# Patient Record
Sex: Female | Born: 1935 | Race: White | Hispanic: No | Smoking: Never smoker
Health system: Southern US, Community
[De-identification: ages and names within clinical notes are randomized; demographics above are authoritative.]

## PROBLEM LIST (undated history)

## (undated) DIAGNOSIS — I10 Essential (primary) hypertension: Secondary | ICD-10-CM

## (undated) DIAGNOSIS — E039 Hypothyroidism, unspecified: Secondary | ICD-10-CM

## (undated) DIAGNOSIS — Z923 Personal history of irradiation: Secondary | ICD-10-CM

## (undated) DIAGNOSIS — C50919 Malignant neoplasm of unspecified site of unspecified female breast: Secondary | ICD-10-CM

## (undated) DIAGNOSIS — E782 Mixed hyperlipidemia: Secondary | ICD-10-CM

## (undated) HISTORY — DX: Personal history of irradiation: Z92.3

## (undated) HISTORY — DX: Malignant neoplasm of unspecified site of unspecified female breast: C50.919

## (undated) HISTORY — PX: ROTATOR CUFF REPAIR: SHX139

## (undated) HISTORY — DX: Mixed hyperlipidemia: E78.2

## (undated) HISTORY — PX: ABDOMINAL HYSTERECTOMY: SHX81

## (undated) HISTORY — PX: TOTAL HIP ARTHROPLASTY: SHX124

---

## 1988-02-19 HISTORY — PX: MASTECTOMY: SHX3

## 2001-03-21 ENCOUNTER — Encounter: Payer: Self-pay | Admitting: Neurology

## 2001-03-21 ENCOUNTER — Ambulatory Visit (HOSPITAL_COMMUNITY): Admission: RE | Admit: 2001-03-21 | Discharge: 2001-03-21 | Payer: Self-pay | Admitting: Neurology

## 2004-06-05 ENCOUNTER — Ambulatory Visit: Payer: Self-pay | Admitting: Oncology

## 2004-12-05 ENCOUNTER — Ambulatory Visit: Payer: Self-pay | Admitting: Oncology

## 2005-01-22 ENCOUNTER — Inpatient Hospital Stay (HOSPITAL_COMMUNITY): Admission: EM | Admit: 2005-01-22 | Discharge: 2005-01-25 | Payer: Self-pay | Admitting: Emergency Medicine

## 2005-01-22 ENCOUNTER — Ambulatory Visit: Payer: Self-pay | Admitting: Physical Medicine & Rehabilitation

## 2005-01-25 ENCOUNTER — Inpatient Hospital Stay (HOSPITAL_COMMUNITY)
Admission: RE | Admit: 2005-01-25 | Discharge: 2005-02-01 | Payer: Self-pay | Admitting: Physical Medicine & Rehabilitation

## 2005-02-03 ENCOUNTER — Inpatient Hospital Stay (HOSPITAL_COMMUNITY): Admission: AD | Admit: 2005-02-03 | Discharge: 2005-02-07 | Payer: Self-pay | Admitting: Orthopedic Surgery

## 2005-12-05 ENCOUNTER — Ambulatory Visit: Payer: Self-pay | Admitting: Oncology

## 2006-01-17 ENCOUNTER — Emergency Department (HOSPITAL_COMMUNITY): Admission: EM | Admit: 2006-01-17 | Discharge: 2006-01-17 | Payer: Self-pay | Admitting: Emergency Medicine

## 2006-11-22 IMAGING — CR DG HIP 1V PORT*L*
1 series · 1 of 1 positions shown · non-contrast
Comparison: pelvis film of earlier in the evening

CLINICAL DATA: arthroplasty
 PORTABLE LATERAL LEFT HIP- 1 VIEW:

[view not recorded]
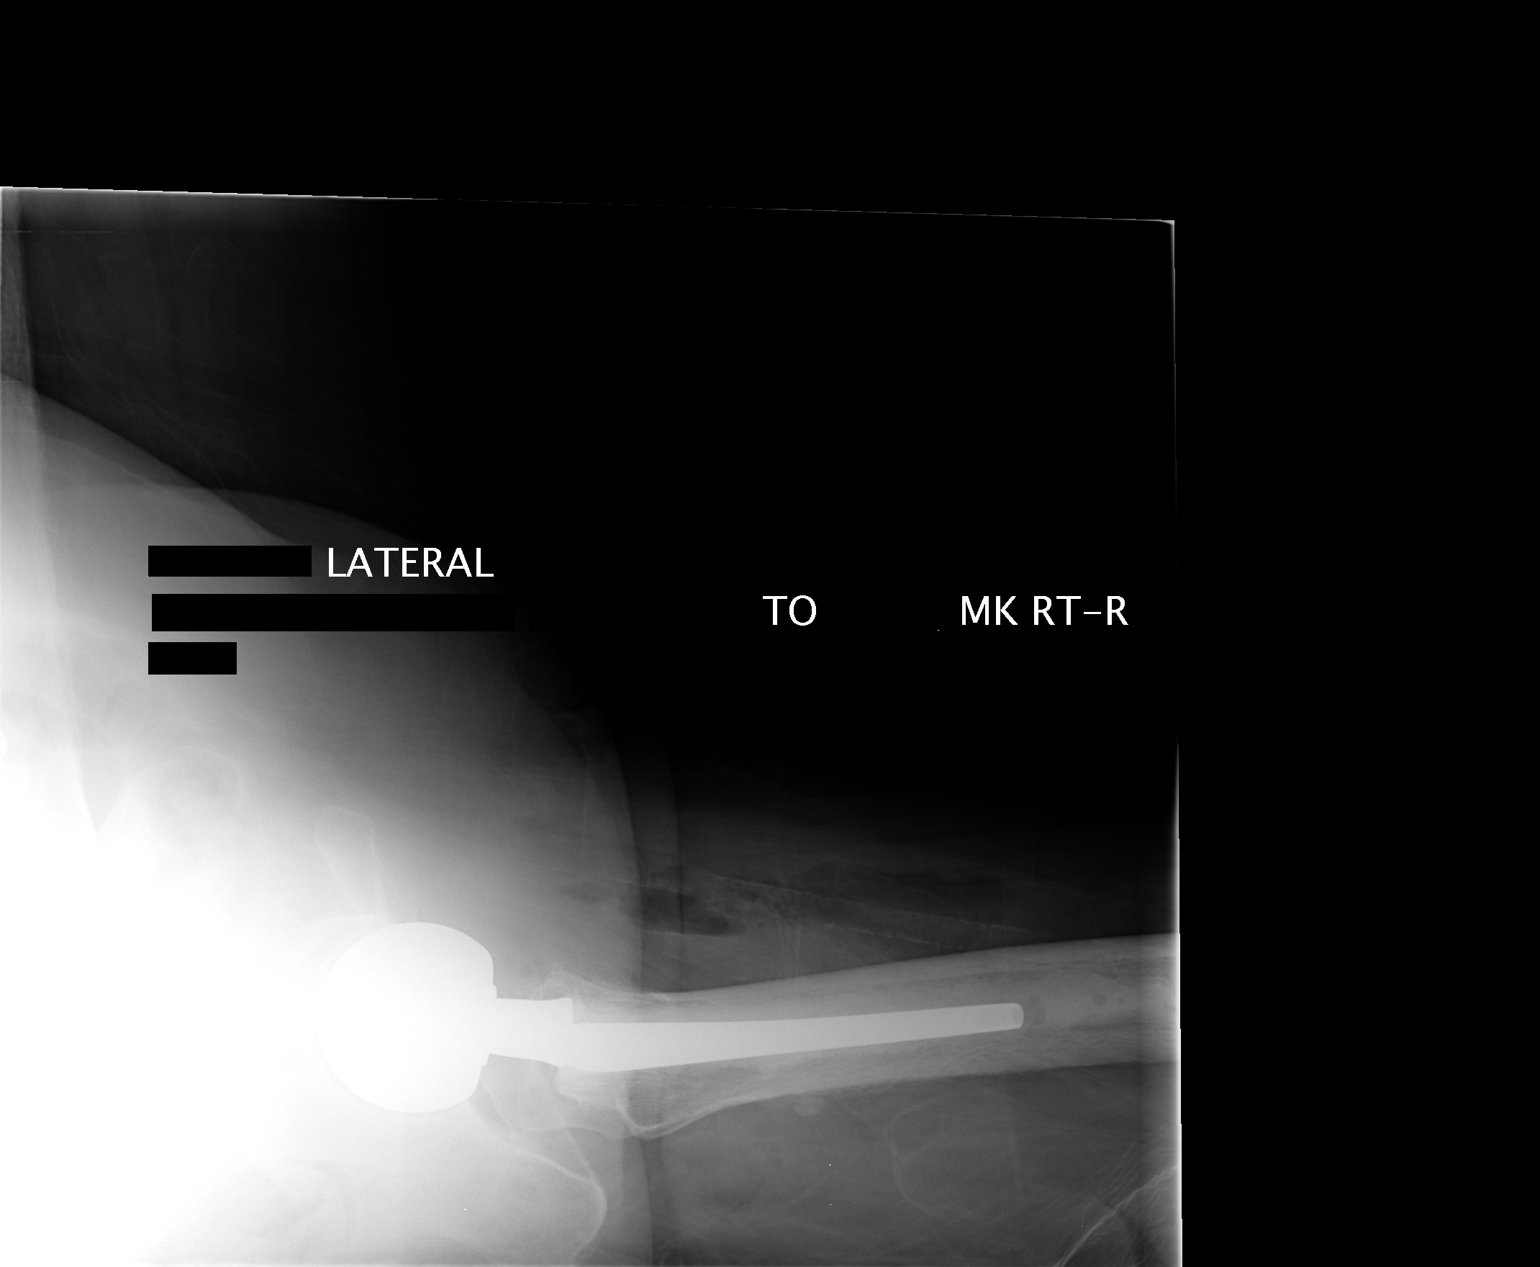

[1 of 1 positions shown; findings below may reference images not displayed]

FINDINGS: Patient is status post left hip arthroplasty.  There is no evidence of dislocation.  No periprosthetic fracture or other complication identified.  
 Small amount of subcutaneous air is seen.
IMPRESSION: Expected postoperative appearance status post left hip arthroplasty.

## 2006-11-22 IMAGING — CR DG CHEST 1V
1 series · 1 of 1 positions shown · non-contrast
Comparison: none

CLINICAL DATA: Left hip fracture.
 CHEST RADIOGRAPH ? 1 VIEW ? 01/22/05: 
 No prior studies.

[view not recorded]
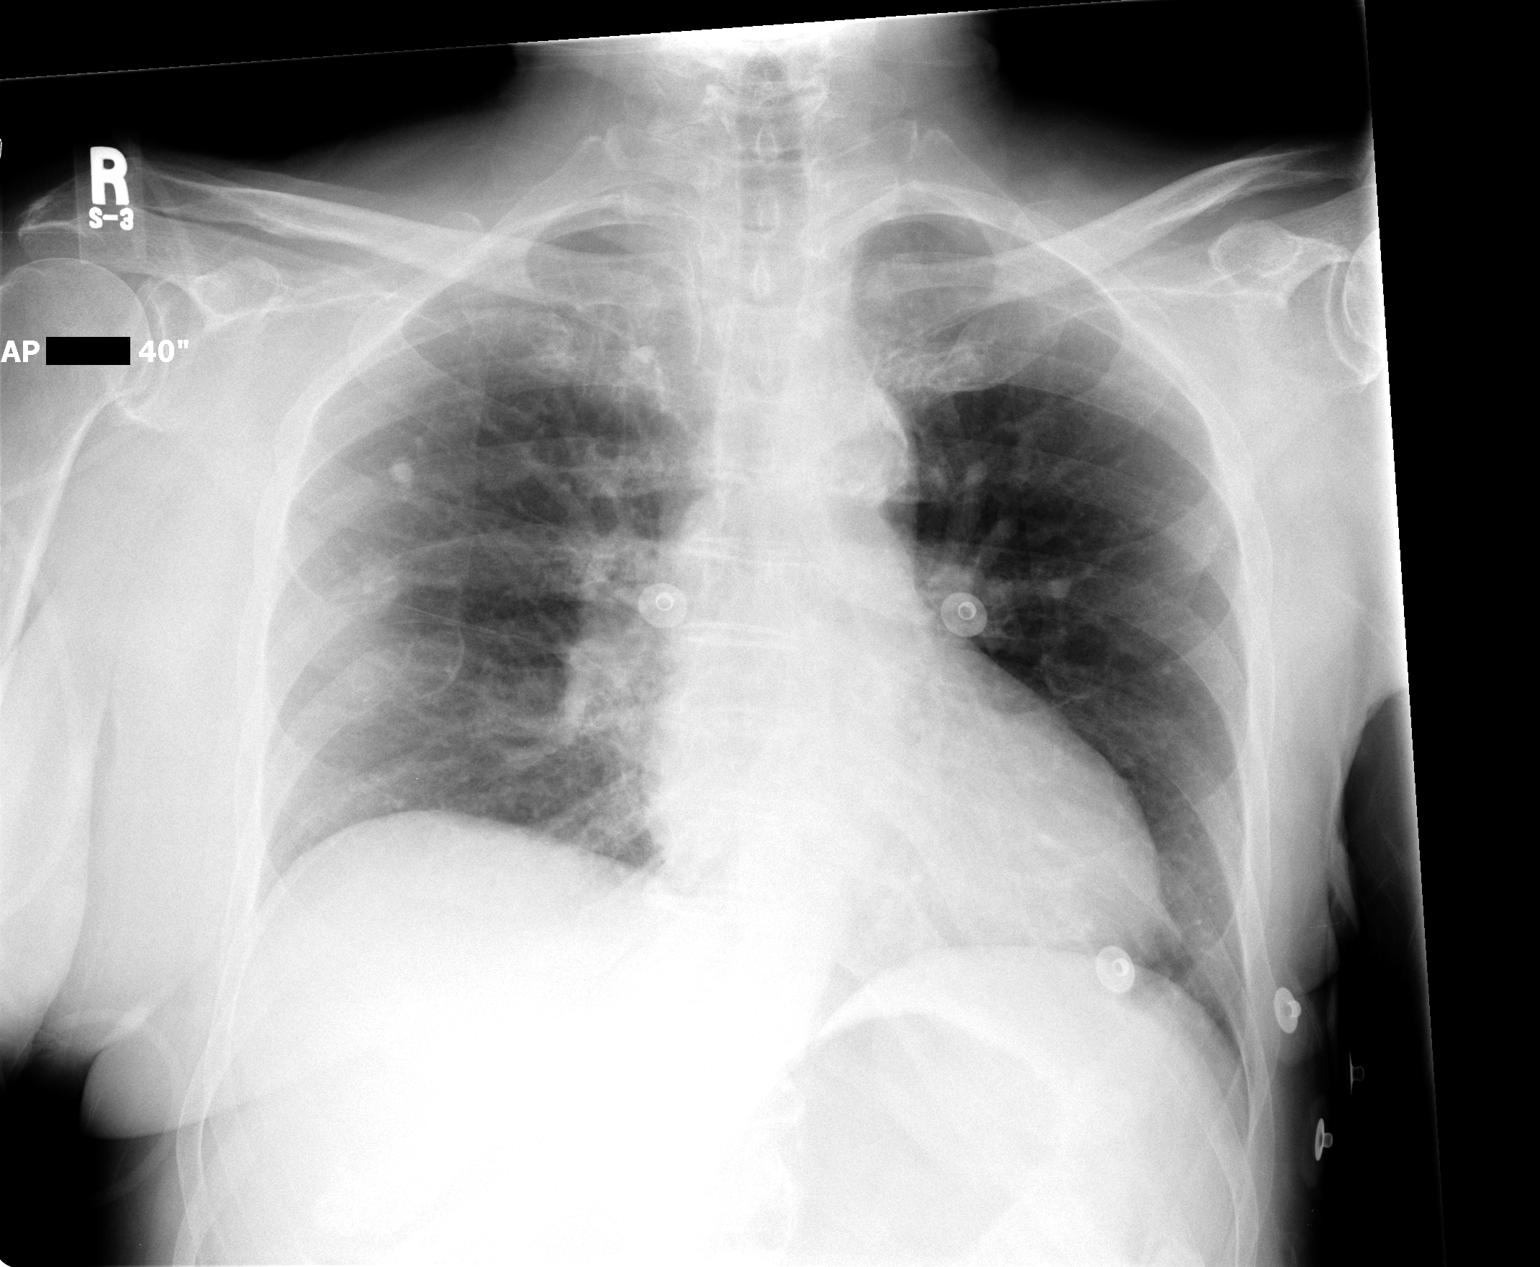

[1 of 1 positions shown; findings below may reference images not displayed]

FINDINGS: There is ill-defined nodularity projecting over the posterolateral portion of the sixth rib, with indistinctness of the sixth rib itself.  While this could represent confluence of vascular shadows, I cannot exclude a pulmonary nodule at this site.  Moreover, just cephalad to this projecting over the right rib, there is a rounded density at the tip of several pulmonary vessels which may represent a vessel on end or a calcified granuloma.  I doubt that this second finding represents a true pulmonary nodule.  However, given the constellation of findings, I would suggest either follow-up chest radiography in one month time, or chest CT for further characterization.  
 There is atherosclerotic calcification of the aortic arch.
IMPRESSION: 1.  Vague nodularity in the right midlung.  Some of this may represent confluence of osseous and vascular shadows, but I recommend at least follow-up chest radiograph in one months time for further characterization.

## 2006-12-04 IMAGING — CR DG HIP (WITH OR WITHOUT PELVIS) 2-3V*L*
3 series · 3 of 3 positions shown · non-contrast
Comparison: 01/22/05.

CLINICAL DATA: Post-op from left hip replacement.  Worsening left hip pain.  Nausea and vomiting.
 LEFT HIP WITH PELVIS ? 3 VIEW:

[t hip ap left]
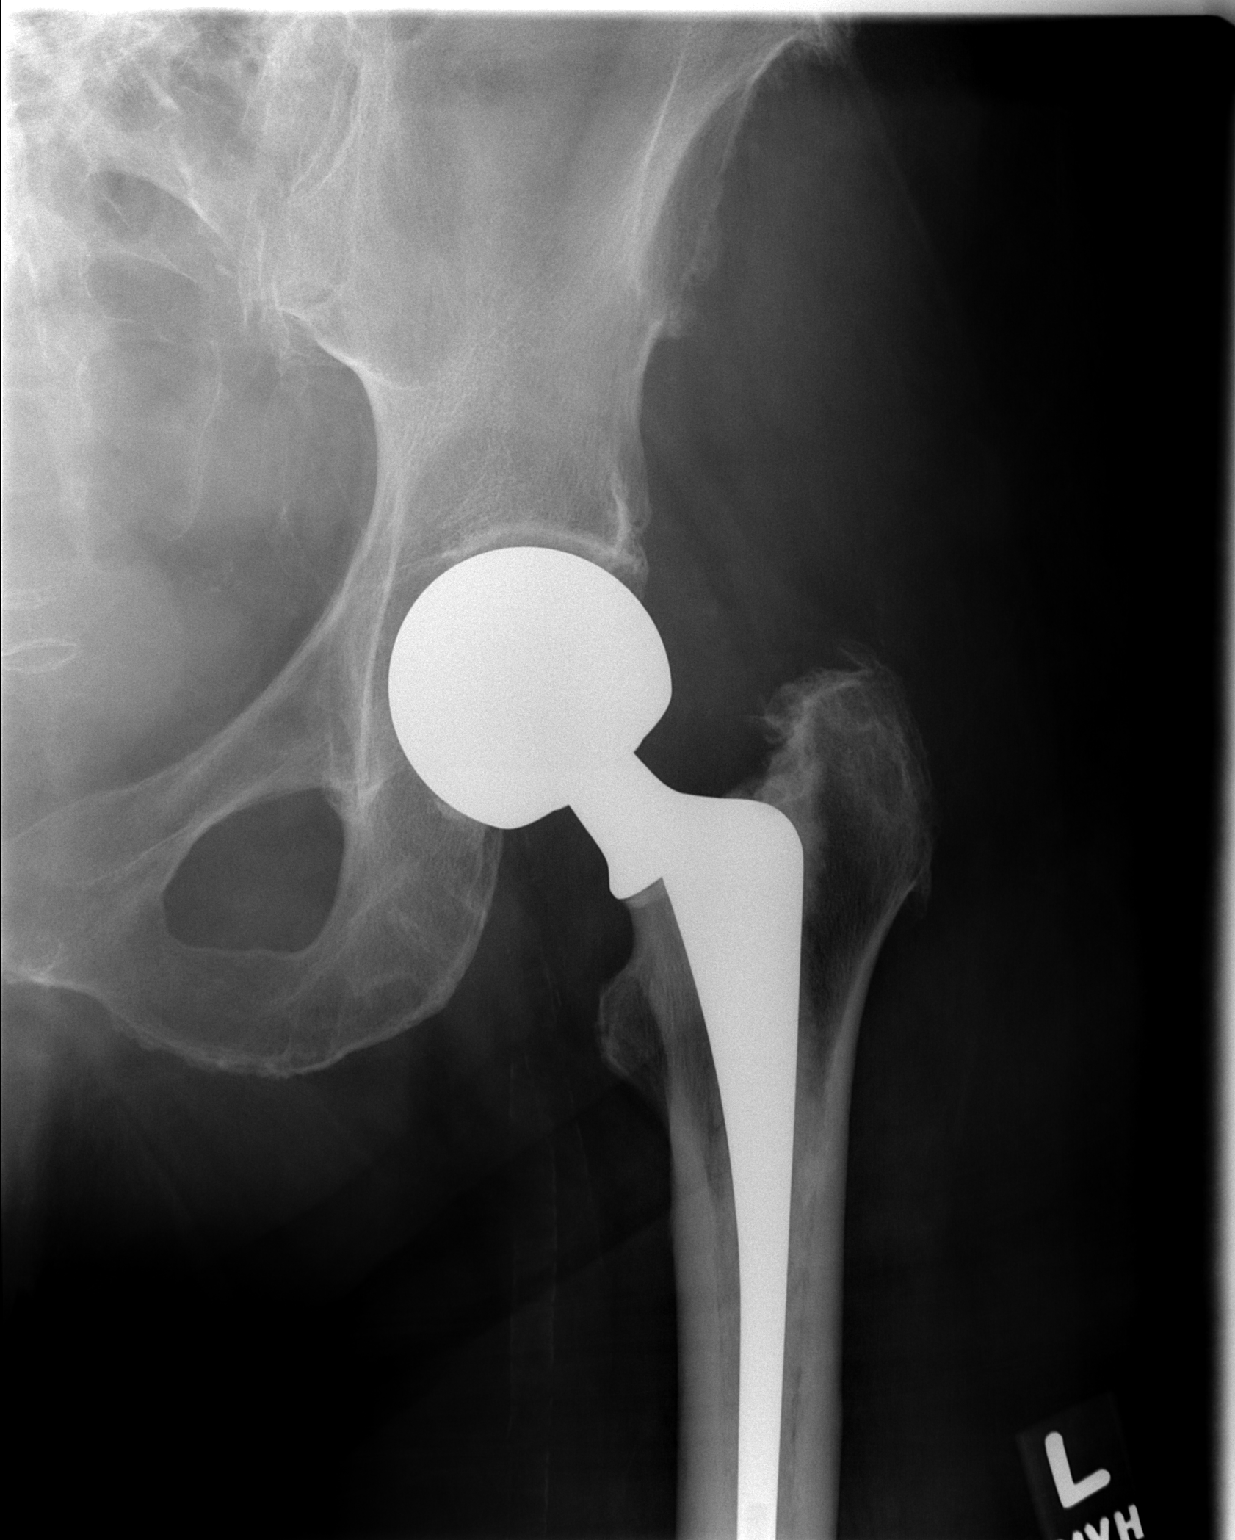

[t pelvis a.p.]
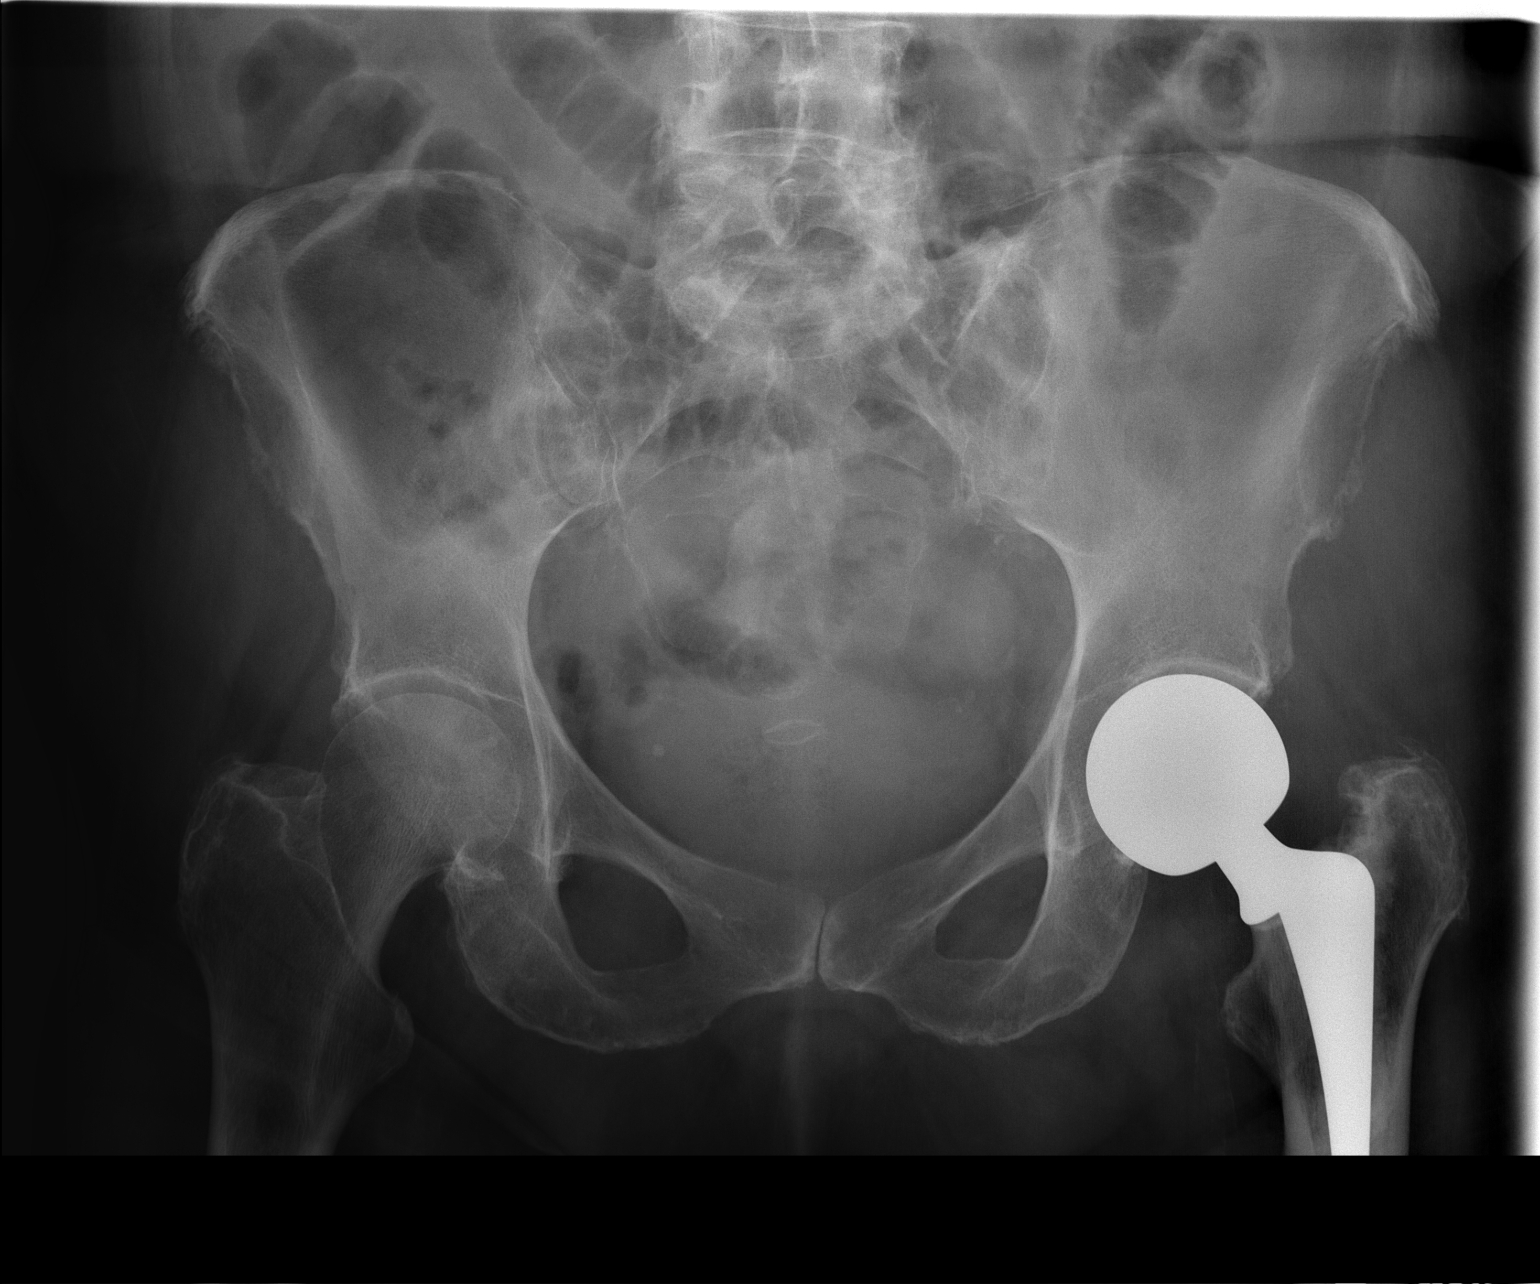

[w hip lateral left *]
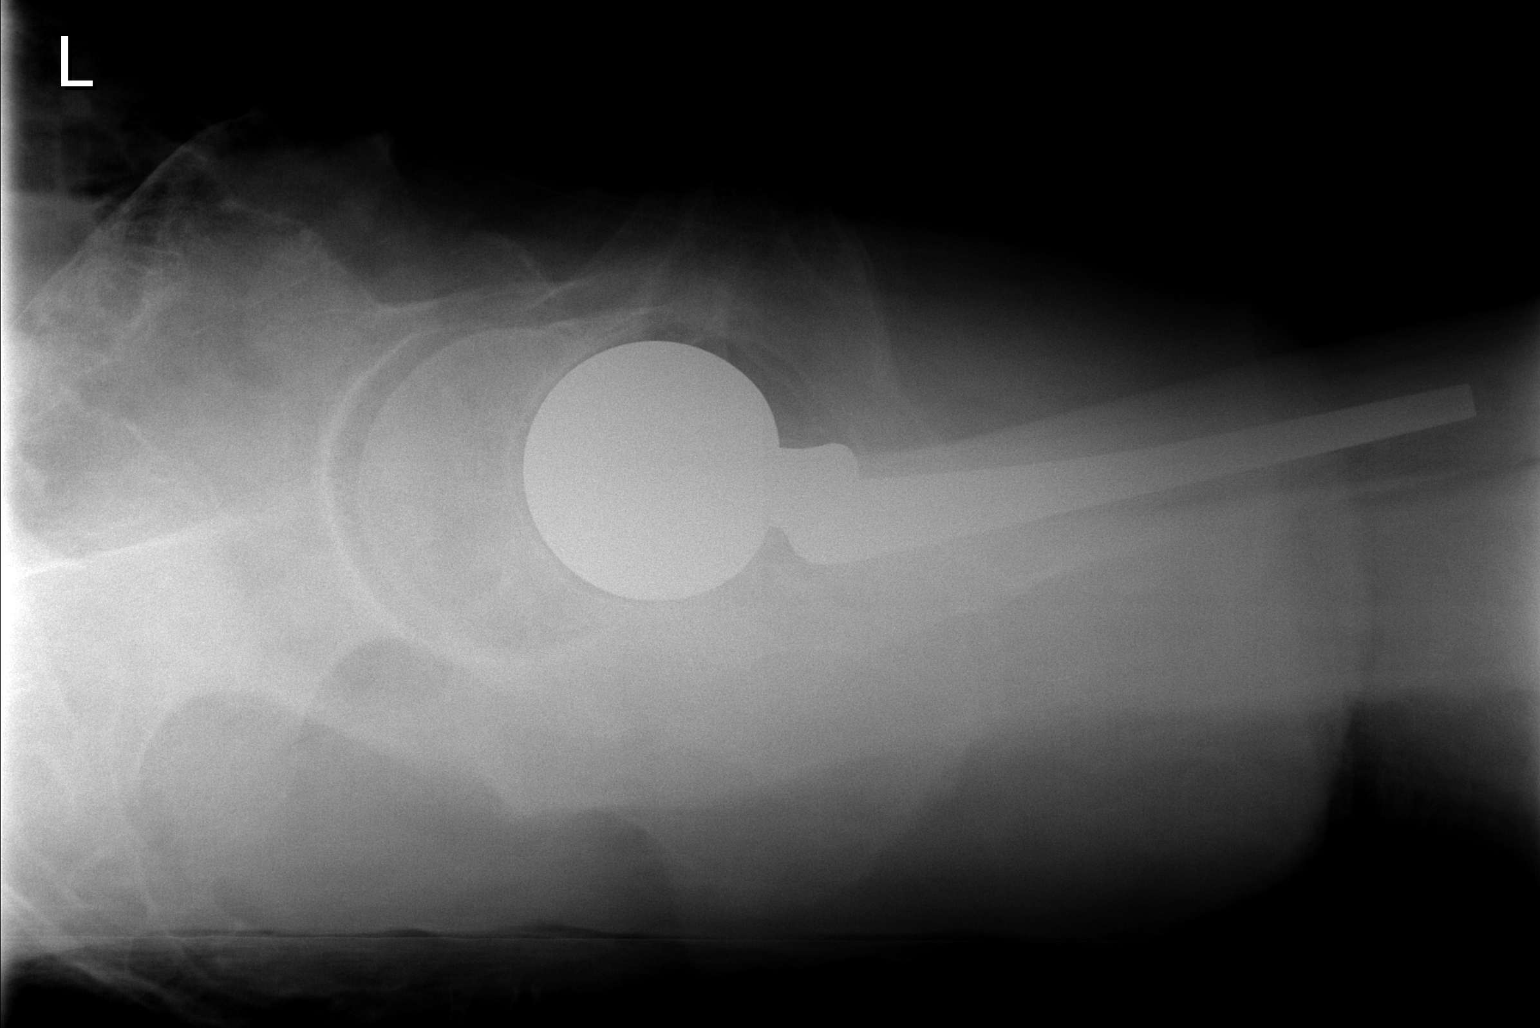

[3 of 3 positions shown; findings below may reference images not displayed]

FINDINGS: A left hip prosthesis is seen in expected position.  There is no evidence of fracture or dislocation.  There is no evidence of bone destruction or periosteal reaction.  No other bone abnormalities are identified.  Decreased subcutaneous emphysema is seen compared with the prior study.
IMPRESSION: Expected postoperative appearance of the left hip arthroplasty.  No evidence of fracture or other complication.
 ABDOMEN ? 2 VIEW:
FINDINGS: Scattered small bowel and colonic gas is seen without evidence of dilated bowel loops.  There is no evidence of free intraperitoneal air.  No radiopaque calculi are seen.  Several pelvic phleboliths are noted.
IMPRESSION: Nonspecific, nonobstructive bowel gas pattern.

## 2006-12-16 ENCOUNTER — Ambulatory Visit: Payer: Self-pay | Admitting: Oncology

## 2006-12-18 LAB — CBC WITH DIFFERENTIAL/PLATELET
BASO%: 0.4 % (ref 0.0–2.0)
Basophils Absolute: 0 10e3/uL (ref 0.0–0.1)
EOS%: 2.2 % (ref 0.0–7.0)
Eosinophils Absolute: 0.2 10e3/uL (ref 0.0–0.5)
HCT: 36.8 % (ref 34.8–46.6)
HGB: 12.9 g/dL (ref 11.6–15.9)
LYMPH%: 23.4 % (ref 14.0–48.0)
MCH: 31.5 pg (ref 26.0–34.0)
MCHC: 35 g/dL (ref 32.0–36.0)
MCV: 89.9 fL (ref 81.0–101.0)
MONO#: 0.5 10e3/uL (ref 0.1–0.9)
MONO%: 5.9 % (ref 0.0–13.0)
NEUT#: 5.3 10e3/uL (ref 1.5–6.5)
NEUT%: 68.1 % (ref 39.6–76.8)
Platelets: 312 10e3/uL (ref 145–400)
RBC: 4.09 10e6/uL (ref 3.70–5.32)
RDW: 13.2 % (ref 11.3–14.5)
WBC: 7.9 10e3/uL (ref 3.9–10.0)
lymph#: 1.8 10e3/uL (ref 0.9–3.3)

## 2006-12-19 LAB — COMPREHENSIVE METABOLIC PANEL
AST: 17 U/L (ref 0–37)
BUN: 18 mg/dL (ref 6–23)
Calcium: 9.1 mg/dL (ref 8.4–10.5)
Chloride: 105 mEq/L (ref 96–112)
Creatinine, Ser: 0.96 mg/dL (ref 0.40–1.20)
Glucose, Bld: 145 mg/dL — ABNORMAL HIGH (ref 70–99)

## 2006-12-19 LAB — CANCER ANTIGEN 27.29: CA 27.29: 17 U/mL (ref 0–39)

## 2006-12-19 LAB — LACTATE DEHYDROGENASE: LDH: 162 U/L (ref 94–250)

## 2007-12-15 ENCOUNTER — Ambulatory Visit: Payer: Self-pay | Admitting: Oncology

## 2008-02-16 ENCOUNTER — Ambulatory Visit: Payer: Self-pay | Admitting: Oncology

## 2008-02-18 LAB — CBC WITH DIFFERENTIAL/PLATELET
BASO%: 0.8 % (ref 0.0–2.0)
Basophils Absolute: 0 10*3/uL (ref 0.0–0.1)
EOS%: 5.4 % (ref 0.0–7.0)
Eosinophils Absolute: 0.2 10*3/uL (ref 0.0–0.5)
HCT: 34 % — ABNORMAL LOW (ref 34.8–46.6)
HGB: 11.7 g/dL (ref 11.6–15.9)
LYMPH%: 41.9 % (ref 14.0–48.0)
MCH: 29.2 pg (ref 26.0–34.0)
MCHC: 34.5 g/dL (ref 32.0–36.0)
MCV: 84.8 fL (ref 81.0–101.0)
MONO#: 1.1 10*3/uL — ABNORMAL HIGH (ref 0.1–0.9)
MONO%: 26.4 % — ABNORMAL HIGH (ref 0.0–13.0)
NEUT#: 1 10*3/uL — ABNORMAL LOW (ref 1.5–6.5)
NEUT%: 25.5 % — ABNORMAL LOW (ref 39.6–76.8)
Platelets: 328 10*3/uL (ref 145–400)
RBC: 4.01 10*6/uL (ref 3.70–5.32)
RDW: 14.3 % (ref 11.3–14.5)
WBC: 4.1 10*3/uL (ref 3.9–10.0)
lymph#: 1.7 10*3/uL (ref 0.9–3.3)

## 2008-02-22 LAB — COMPREHENSIVE METABOLIC PANEL
AST: 17 U/L (ref 0–37)
Albumin: 3.9 g/dL (ref 3.5–5.2)
Alkaline Phosphatase: 106 U/L (ref 39–117)
Potassium: 4.1 mEq/L (ref 3.5–5.3)
Sodium: 137 mEq/L (ref 135–145)
Total Protein: 6.6 g/dL (ref 6.0–8.3)

## 2008-02-22 LAB — VITAMIN D 25 HYDROXY (VIT D DEFICIENCY, FRACTURES): Vit D, 25-Hydroxy: 44 ng/mL (ref 30–89)

## 2008-06-30 ENCOUNTER — Ambulatory Visit: Admission: RE | Admit: 2008-06-30 | Discharge: 2008-06-30 | Payer: Self-pay | Admitting: Gynecologic Oncology

## 2008-07-12 ENCOUNTER — Encounter: Payer: Self-pay | Admitting: Gynecologic Oncology

## 2008-07-12 ENCOUNTER — Inpatient Hospital Stay (HOSPITAL_COMMUNITY): Admission: RE | Admit: 2008-07-12 | Discharge: 2008-07-13 | Payer: Self-pay | Admitting: Obstetrics & Gynecology

## 2008-07-21 ENCOUNTER — Ambulatory Visit (HOSPITAL_COMMUNITY): Admission: RE | Admit: 2008-07-21 | Discharge: 2008-07-21 | Payer: Self-pay | Admitting: Obstetrics & Gynecology

## 2008-07-21 ENCOUNTER — Ambulatory Visit: Payer: Self-pay | Admitting: Vascular Surgery

## 2008-07-21 ENCOUNTER — Encounter: Payer: Self-pay | Admitting: Obstetrics & Gynecology

## 2008-07-28 ENCOUNTER — Ambulatory Visit: Admission: RE | Admit: 2008-07-28 | Discharge: 2008-07-28 | Payer: Self-pay | Admitting: Gynecologic Oncology

## 2008-08-25 ENCOUNTER — Ambulatory Visit: Admission: RE | Admit: 2008-08-25 | Discharge: 2008-08-25 | Payer: Self-pay | Admitting: Gynecologic Oncology

## 2008-09-30 ENCOUNTER — Ambulatory Visit: Admission: RE | Admit: 2008-09-30 | Discharge: 2008-11-02 | Payer: Self-pay | Admitting: Radiation Oncology

## 2008-10-20 ENCOUNTER — Ambulatory Visit (HOSPITAL_COMMUNITY): Admission: RE | Admit: 2008-10-20 | Discharge: 2008-10-20 | Payer: Self-pay | Admitting: Radiation Oncology

## 2009-03-30 ENCOUNTER — Other Ambulatory Visit: Admission: RE | Admit: 2009-03-30 | Discharge: 2009-03-30 | Payer: Self-pay | Admitting: Gynecologic Oncology

## 2009-03-30 ENCOUNTER — Ambulatory Visit: Admission: RE | Admit: 2009-03-30 | Discharge: 2009-03-30 | Payer: Self-pay | Admitting: Gynecologic Oncology

## 2009-07-04 ENCOUNTER — Other Ambulatory Visit: Admission: RE | Admit: 2009-07-04 | Discharge: 2009-07-04 | Payer: Self-pay | Admitting: Radiation Oncology

## 2009-07-04 ENCOUNTER — Ambulatory Visit: Admission: RE | Admit: 2009-07-04 | Discharge: 2009-07-04 | Payer: Self-pay | Admitting: Radiation Oncology

## 2009-10-05 ENCOUNTER — Ambulatory Visit: Admission: RE | Admit: 2009-10-05 | Discharge: 2009-10-05 | Payer: Self-pay | Admitting: Gynecologic Oncology

## 2010-01-29 ENCOUNTER — Other Ambulatory Visit
Admission: RE | Admit: 2010-01-29 | Discharge: 2010-01-29 | Payer: Self-pay | Source: Home / Self Care | Admitting: Radiation Oncology

## 2010-01-29 ENCOUNTER — Ambulatory Visit
Admission: RE | Admit: 2010-01-29 | Discharge: 2010-01-29 | Payer: Self-pay | Source: Home / Self Care | Attending: Radiation Oncology | Admitting: Radiation Oncology

## 2010-05-24 ENCOUNTER — Ambulatory Visit: Payer: Medicare Other | Attending: Gynecologic Oncology | Admitting: Gynecologic Oncology

## 2010-05-24 DIAGNOSIS — I10 Essential (primary) hypertension: Secondary | ICD-10-CM | POA: Insufficient documentation

## 2010-05-24 DIAGNOSIS — C549 Malignant neoplasm of corpus uteri, unspecified: Secondary | ICD-10-CM | POA: Insufficient documentation

## 2010-05-24 DIAGNOSIS — F0280 Dementia in other diseases classified elsewhere without behavioral disturbance: Secondary | ICD-10-CM | POA: Insufficient documentation

## 2010-05-24 DIAGNOSIS — E7800 Pure hypercholesterolemia, unspecified: Secondary | ICD-10-CM | POA: Insufficient documentation

## 2010-05-24 DIAGNOSIS — G309 Alzheimer's disease, unspecified: Secondary | ICD-10-CM | POA: Insufficient documentation

## 2010-05-24 DIAGNOSIS — Z853 Personal history of malignant neoplasm of breast: Secondary | ICD-10-CM | POA: Insufficient documentation

## 2010-05-24 DIAGNOSIS — E039 Hypothyroidism, unspecified: Secondary | ICD-10-CM | POA: Insufficient documentation

## 2010-05-28 NOTE — Consult Note (Signed)
[name], Jasmin Beck NO.:  [id number]  MEDICAL RECORD NO.:  [id number]          PATIENT TYPE:  OUT  LOCATION:  [facility]                         FACILITY:  [facility]  PHYSICIAN:  Laurette Schimke, MD     DATE OF BIRTH:  [birth year]  DATE OF CONSULTATION:  05/24/2010 DATE OF DISCHARGE:                                CONSULTATION   REASON FOR VISIT:  Surveillance of stage II endometrial cancer.  HISTORY OF PRESENT ILLNESS:  This is an almost 77 year old who presented with uterine bleeding in February 2010.  Dr. Leota Jacobsen evaluated her and performed an endometrial biopsy that demonstrated a grade 1 endometrial cancer.  On Jul 12, 2008, she underwent a total laparoscopic hysterectomy, bilateral salpingo-oophorectomy, bilateral pelvic lymph node dissection and lysis of adhesions.  Final pathology was notable for involvement of the cervix with a grade 1 endometrioid adenocarcinoma. She was stage II and received adjuvant radiation therapy with high-dose- rate brachytherapy completed in September 2010.  Jasmin Beck has been without evidence of disease since.  PAST MEDICAL HISTORY:  Hypertension, hypercholesterolemia, left breast cancer diagnosed in 2010 treated with adjuvant tamoxifen for 2 years, hypothyroidism May 2010, worsening hearing loss, slowly progressive Alzheimer's disease.  PAST SURGICAL HISTORY:  Left modified radical mastectomy and axillary lymph node dissection in 2000, left rotator cuff surgery in 1980, left hip surgery, cesarean section in 1950, laparoscopic hysterectomy, bilateral salpingo-oophorectomy, bilateral pelvic lymph node dissection in 2010.  SOCIAL HISTORY:  Jasmin Beck lives on her own.  She lives across the street from her son.  Is independent.  She reports difficulty with ambulating and stumbling and requiring the use of a cane this year.  FAMILY HISTORY:  No family history changes.  REVIEW OF SYSTEMS:  Worsening hearing loss, even worse than  the last visit.  Denies vaginal bleeding.  No headache, cough, shortness of breath, chest pain.  No abdominal bloating.  No diarrhea or constipation, hematuria, hematochezia or lower extremity edema.  PHYSICAL EXAMINATION:  GENERAL:  A well-developed female in no acute distress.  In excellent spirits. VITAL SIGNS:  Weight 143 pounds, blood pressure 130/60, pulse of 74. CHEST:  Clear to auscultation. HEART:  Regular rate and rhythm. BACK:  No CVA tenderness. ABDOMEN:  Soft, obese, nontender.  Midline incision without any evidence of a hernia. LYMPH NODE SURVEY:  No cervical, supraclavicular or inguinal adenopathy. PELVIC EXAMINATION:  Normal external genitalia, Bartholin's, urethra and Skene's.  No lesions noted within the vagina.  Significant vaginal atrophy.  No cul-de-sac nodularity. RECTAL EXAMINATION:  Very poor anal sphincter tone.  No rectovaginal septum nodularity.  No cul-de-sac masses.  IMPRESSION AND PLAN:  Jasmin Beck remains without any evidence of disease from her stage 2B grade 1 endometrioid adenocarcinoma. Papanicolaou test was performed by Dr. Roselind Messier in December 2011 and was within normal limits.  She will follow up with Dr. Roselind Messier in August and will follow up with the Gynecology/Oncology service in January 2013.     Laurette Schimke, MD     WB/MEDQ  D:  05/24/2010  T:  05/25/2010  Job:  [id number]  cc:   Billie Lade, Ph.D.,  M.D. Fax: [phone]  Hassell Done Fax: [phone]  Jeanmarie Plant Fax: [phone]  Telford Nab, R.N. [address] [place], [place] [address]  Electronically Signed by Laurette Schimke MD on 05/28/2010 11:35:25 AM

## 2010-05-29 LAB — BASIC METABOLIC PANEL
CO2: 22 mEq/L (ref 19–32)
CO2: 23 mEq/L (ref 19–32)
Calcium: 7.4 mg/dL — ABNORMAL LOW (ref 8.4–10.5)
Calcium: 7.8 mg/dL — ABNORMAL LOW (ref 8.4–10.5)
Chloride: 97 mEq/L (ref 96–112)
GFR calc Af Amer: >60 mL/min (ref >60)
Glucose, Bld: 132 mg/dL — ABNORMAL HIGH (ref 70–99)
Potassium: 3.9 mEq/L (ref 3.5–5.1)
Sodium: 124 mEq/L — ABNORMAL LOW (ref 135–145)
Sodium: 126 mEq/L — ABNORMAL LOW (ref 135–145)

## 2010-05-29 LAB — CBC
HCT: 26.6 % — ABNORMAL LOW (ref 36.0–46.0)
HCT: 27.3 % — ABNORMAL LOW (ref 36.0–46.0)
Hemoglobin: 13 g/dL (ref 12.0–15.0)
Hemoglobin: 9 g/dL — ABNORMAL LOW (ref 12.0–15.0)
Hemoglobin: 9.1 g/dL — ABNORMAL LOW (ref 12.0–15.0)
MCHC: 33 g/dL (ref 30.0–36.0)
MCHC: 33.6 g/dL (ref 30.0–36.0)
MCHC: 34 g/dL (ref 30.0–36.0)
MCV: 88.7 fL (ref 78.0–100.0)
RBC: 3.05 MIL/uL — ABNORMAL LOW (ref 3.87–5.11)
RBC: 4.35 MIL/uL (ref 3.87–5.11)
RDW: 15.7 % — ABNORMAL HIGH (ref 11.5–15.5)
RDW: 16.4 % — ABNORMAL HIGH (ref 11.5–15.5)

## 2010-05-29 LAB — DIFFERENTIAL
Eosinophils Absolute: 0.1 10*3/uL (ref 0.0–0.7)
Lymphocytes Relative: 25 % (ref 12–46)
Lymphs Abs: 1.7 10*3/uL (ref 0.7–4.0)
Neutro Abs: 4.4 10*3/uL (ref 1.7–7.7)
Neutrophils Relative %: 63 % (ref 43–77)

## 2010-05-29 LAB — COMPREHENSIVE METABOLIC PANEL
CO2: 25 mEq/L (ref 19–32)
Calcium: 8.8 mg/dL (ref 8.4–10.5)
Creatinine, Ser: 0.85 mg/dL (ref 0.4–1.2)
GFR calc non Af Amer: >60 mL/min (ref >60)
Glucose, Bld: 155 mg/dL — ABNORMAL HIGH (ref 70–99)
Total Protein: 6.2 g/dL (ref 6.0–8.3)

## 2010-05-29 LAB — TYPE AND SCREEN: Antibody Screen: NEGATIVE

## 2010-05-31 ENCOUNTER — Ambulatory Visit: Payer: Self-pay | Admitting: Gynecologic Oncology

## 2010-07-03 NOTE — Consult Note (Signed)
Jasmin Beck, Jasmin Beck NO.:  [id number]   MEDICAL RECORD NO.:  [id number]          PATIENT TYPE:  OUT   LOCATION:  GYN                          FACILITY:  [facility]   PHYSICIAN:  Laurette Schimke, MD     DATE OF BIRTH:  [birth year]   DATE OF CONSULTATION:  07/28/2008  DATE OF DISCHARGE:                                 CONSULTATION   HISTORY OF PRESENT ILLNESS:  This is an 77 year old gravida 2, para 2  who noted uterine bleeding in February of 2010.  The patient underwent  uterine biopsy on June 16, 2008.  The pathology was consistent with  grade 1 endometrioid adenocarcinoma.  On Jul 12, 2008 she underwent a  laparoscopic hysterectomy, bilateral salpingo-oophorectomy, omental  excision, bilateral pelvic lymph node sampling.  Final pathology was  consistent with a grade 1 endometrioid adenocarcinoma involving the  cervical stroma.  The tumor measured 3.8 cm in greatest dimension and  invaded up to 7 mm of a 15 mm uterine wall.  There was no lymph or  vascular space invasion.  Peritoneal washings were negative.  Postoperative course was complicated by bilateral lower extremity edema.  Dopplers were obtained by Dr. Antionette Char and were negative.  The  inferior most laparoscopic incision was noted to have minimal  separation.  Ecchymosis was noted on lateral aspect of bilateral thighs.  These are resolving.   PHYSICAL EXAMINATION:  Today she complains of some fatigue.  She denies  any vaginal bleeding or abdominal pain.  She is a well-developed female  in no acute distress.  Blood pressure 130/68.  There is ecchymosis over  the lower aspect of the wound extending to the mons.  It is nontender  and there is no warmth.  The inferior most laparoscopic incision site  has some minimal superficial separation.   Stage II endometrioid endometrial adenocarcinoma.  The patient was  informed that vaginal cuff brachy therapy will be indicated given the  cervical stromal  involvement.  Lymph nodes are noted to be negative.  She will follow up in 1 month for a second postoperative check and if  that is within normal limits she will be referred to Radiation Oncology  at [facility] for high dose rate brachy therapy.  The  patient's family were with her and all of their questions were answered.      Laurette Schimke, MD  Electronically Signed     WB/MEDQ  D:  07/28/2008  T:  07/28/2008  Job:  [id number]   cc:   Hassell Done  Fax: [phone]   Telford Nab, R.N.  [phone] [address]  [place], [place] [address]

## 2010-07-03 NOTE — Op Note (Signed)
NAMEKARMON, ANDIS              ACCOUNT NO.:  [id number]   MEDICAL RECORD NO.:  [id number]          PATIENT TYPE:  INP   LOCATION:  1238                         FACILITY:  [facility]   PHYSICIAN:  Paola A. Duard Brady, MD    DATE OF BIRTH:  November 08, 1919   DATE OF PROCEDURE:  07/12/2008  DATE OF DISCHARGE:                               OPERATIVE REPORT   PREOPERATIVE DIAGNOSIS:  Grade 1 endometrial carcinoma.   POSTOPERATIVE DIAGNOSIS:  Grade 1 endometrial carcinoma.   PROCEDURE:  Total laparoscopic hysterectomy, bilateral salpingo-  oophorectomy, bilateral pelvic lymph node dissection, lysis of  adhesions.   SURGEON:  Carolanne Grumbling.   ASSISTANT:  Aundria Rud.   ANESTHESIA:  General.   ANESTHESIOLOGIST:  Rose.   BLOOD LOSS:  150 mL.   URINE OUTPUT:  525 mL.   INTRAVENOUS FLUIDS:  1750 mL.   COMPLICATIONS:  None.   SPECIMENS:  Included washings, uterus, cervix, bilateral tubes and  ovaries, bilateral pelvic nodes and omentum.   COMPLICATIONS:  None.   DISPOSITION OF PATHOLOGY:  Specimens to pathology.   OPERATIVE FINDINGS:  Included significant adhesive disease of the  omentum to the anterior abdominal wall.  Small bowel was adherent to the  anterior abdominal wall on the patient's right upper quadrant.  Frozen  section revealed a grade 1 endometrioid adenocarcinoma with less than  50% myometrial invasion.  There was no extrauterine disease.   The patient was taken to the operating room and placed in supine  position.  Arms were tucked at her side while awake to ensure patient  comfort.  General anesthesia was then induced.  Please refer to  anesthesia record for all anesthesia issues.  She was then placed in  dorsal lithotomy position with SCDs.  Shoulder blocks were then placed  in the appropriate fashion.  The patient's abdomen was then draped in  the usual fashion.  The perineum was prepped in usual fashion.  Time out  was performed to confirm the patient,  procedure, surgeon, antibiotic and  allergy status.  Once the vagina was cleansed with a Betadine, sterile  speculum was inserted into the vagina.  The cervix was identified.  There was no visible lesions.  The cervix was grasped with single-tooth  tenaculum.  The cervix was dilated without difficulty.  The ZUMI with a  small KOH was then applied in the usual fashion with the pneumo-occluder  balloon.  Foley catheter was inserted in the bladder under sterile  conditions.   The patient was then draped.  Marcaine 0.25% was injected into the  infraumbilical region.  A vertical infraumbilical incision was made with  a knife.  Using the 10/12 OptiView, we entered the peritoneal cavity.  The omental adhesions to the anterior abdominal wall were as noted  above.  A 5-mm trocar was placed 2 cm medial and superior to the ACIS  under direct visualization.  Using monopolar cautery, the omentum was  taken down to the level of the umbilicus.  This allowed visualization to  the point that we were able to place a 5-mm port on the patient's right  side  under direct visualization.  A 10/12 suprapubic port was then  placed.  Washings were obtained.   Our attention was first drawn to the patient's left side.  The round  ligament was transected with monopolar cautery.  The anterior and  posterior leaves of broad ligament were opened.  The ureter was  identified on the patient's left side.  A window was made between the IP  and the ureter.  The IP was coagulated and transected with the Enseal  device.  The uterine vessels were then skeletonized to the level of the  KOH ring.  The bladder flap was created.  Using the Enseal device, the  uterine vessels were coagulated and transected.  A similar procedure was  performed on the patient's right side.  The pneumo-occluder balloon was  then insufflated.  The colpotomy was performed using monopolar cautery  along the KOH ring.  The specimen was delivered.  There  was some omental  adhesions that were in the field of view.  Just prior to the colpotomy,  these omental adhesions and this omentum that was hanging down in front  of the bladder flap was taken down and transected using the Enseal  device.  The uterus, cervix, bilateral tubes and ovaries, and omentum  were delivered through the vagina.  The vagina was closed with 3 sutures  of 0 Vicryl on an Endo stitch.   Frozen section at this time returned showing just less than 50% invasion  with a grade 1 endometrial carcinoma.  The paravesical space was opened  bilaterally after identifying the superior vesical artery.  Attention  was first drawn to the patient's left sidewall.  The pararectal space  was opened as was the paravesical space.  The nodal bundles overlying  the external iliac artery was taken down using monopolar cautery.  We  were clear to ensure that the genital femoral nerve was spared.  At this  point, the node dissection was continued down to circumflex iliac vein.  The external iliac vein was then identified, and nodal bundle  compressing the internal nodal bundle was taken down.  The obturator  nerve was identified, and the nodal bundle superior to that was taken  down using monopolar cautery pinpoint.  There was adequate hemostasis.  The left pelvic lymph nodes were then placed in an EndoCatch bag and  delivered through the 10/12 port.  There was a small amount of bleeding  just behind the obturator nerve.  A moist Ray-Tec was placed in this  area with compression.   Our attention was then drawn to the patient's right side.  As dictated  previously, the superior vesicle was identified, and the paravesical  space was opened as was the pararectal space.  The nodal bundle  overlying the external iliac artery was taken down using monopolar  cautery to the level of the vein.  The vein was then identified, and the  nodal bundle was removed off of this.  The obturator nerve was  then  identified, and the nodal bundle superior to the obturator nerve was  taken down.  This nodal bundle was placed in an EndoCatch bag and  delivered through 10/12 port.  The Ray-Tec was then removed.  The  abdomen and pelvis were copiously irrigated.  We inspected all pedicle  sites under low flow, and they were noted be hemostatic.  All trocars  were removed.  The CO2 was removed from the patient's abdomen.  The  fascia in the supraumbilical port site  was reapproximated using UR6.  There was a subcutaneous stitch in the suprapubic port site.  The skin  was closed using 4-0 Vicryl.  Steri-Strips and Benzoin were applied.  The pneumo-occluder was removed from the vagina.  The vagina was swabbed  and noted to be hemostatic.  The shoulder blocks were removed, and the  shoulders were intact without significant pressure marks.   The patient tolerated the procedure well and was taken to recovery room  in stable condition.  All instrument, needle and Ray-Tec counts were  correct x2.      Paola A. Duard Brady, MD  Electronically Signed     PAG/MEDQ  D:  07/12/2008  T:  07/12/2008  Job:  [id number]   cc:   Telford Nab, R.N.  [address]  [place], [place] [address]   Roseanna Rainbow, M.D.  Fax: [phone]   Richard A. Alanda Amass, M.D.  Fax: [phone]   Hassell Done  Fax: [phone]

## 2010-07-03 NOTE — Consult Note (Signed)
Jasmin Beck, ISIDORO NO.:  [id number]   MEDICAL RECORD NO.:  [id number]          PATIENT TYPE:  OUT   LOCATION:  GYN                          FACILITY:  [facility]   PHYSICIAN:  Laurette Schimke, MD     DATE OF BIRTH:  1919/09/16   DATE OF CONSULTATION:  06/30/2008  DATE OF DISCHARGE:                                 CONSULTATION   REASON FOR ADMISSION:  Endometrial cancer.   HISTORY OF PRESENT ILLNESS:  This is an 77 year old gravida 2, para 2  who was in her usual state of health until she noted vaginal bleeding in  February of 2010.  An endometrial biopsy was performed by Dr. Leota Jacobsen  which was notable for grade 1 endometrial cancer.  [name] reports  no weight loss, shortness of breath, inguinal adenopathy, changes in her  bowel or rectal habits or abdominal symptomatology.   PAST MEDICAL HISTORY:  Notable for:  1. Hypertension for several years.  2. Hypercholesterolemia.  3. Left-sided breast cancer diagnosed in 07-07-98 which was treated with      tamoxifen for 2 years.  4. Hypothyroidism diagnosed in 06-Jul-2008.  5. Hearing loss of 6 or 7 years' duration.  6. Alzheimer's for which she is in avid denial that was diagnosed      approximately 6 years ago.   PAST SURGICAL HISTORY:  1. Left modified radical mastectomy and axillary lymph node dissection      in 07-Jul-1998.  2. Left rotator cuff surgery in 1980.  3. Left hip surgery.  4. Cesarean section in 07/06/1948.   PAST GYNECOLOGIC HISTORY:  Notable for the fact that she is gravida 2,  para 2 with a Cesarean section in 1948-07-06.  Tamoxifen was associated with  bleeding at the termination of 2 years of use at which time she  underwent an endometrial biopsy, which per the patient's daughter and  granddaughter were negative.   SOCIAL HISTORY:  Tobacco use:  Denies.  Alcohol use:  Denies.  She lives  alone.  Her husband died very unexpectedly in 07-06-2004.   FAMILY HISTORY:  Notable for a niece diagnosed with breast cancer  in  her40s who succumbed very quickly to this illness.   MEDICATIONS:  1. Synthroid 50 mcg daily.  2. Fexofenadine 180 mg daily.  3. Lipitor 20 mg 1 tablet daily.  4. Vitamin E 800 international units daily.  5. Multivitamins 1 per day.  6. Aspirin 81 mg daily.  7. Aricept 10 mg daily.  8. Sertraline hydrochloride, also known as Zoloft, 50 mg daily.  9. Naproxen sodium 500 mg daily.  10.Plavix 75 mg daily.  11.Prevacid 30 mg daily.  12.Iron sulfate 1 tablet daily.  13.Diovan 320 mg daily.   REVIEW OF SYSTEMS:  A 10-point review of systems is noteworthy for  intermittent vaginal spotting.  She denies urinary incontinence.  She  does report worsening and difficultly in hearing and state that her gait  is stable.   PHYSICAL EXAMINATION:  Well-developed, very pleasant female in no acute  distress.  Weight 143 pounds.  Height 5 feet 4 inches.  Blood pressure  132/60.  CHEST:  Clear to auscultation bilaterally.  HEART:  Regular rate and rhythm.  ABDOMEN:  Soft.  Midline vertical incision.  No palpable omental cakes,  no hernia, no palpable masses.  PELVIC EXAMINATION:  Normal external genitalia Bartholin's, urethra and  skeins.  There is trace blood in the vaginal vault.  The cervix with  without any lesion.  Uterus is approximately 7 cm.  No adnexal masses  are appreciated.  There is no nodularity in the cul-de-sac.  RECTAL EXAMINATION:  Good anal sphincter tone without any rectal masses.   IMPRESSION:  Grade 1 endometrioid adenocarcinoma.   PLAN:  Laparoscopic-assisted vaginal hysterectomy and bilateral salpingo-  oophorectomy with possible lymph node dissection.  The risks of this  procedure were discussed with the patient .  She is aware that the  procedure will be performed by Dr. Duard Brady.  The questions of the patient  and her family were all answered to their satisfaction.      Laurette Schimke, MD  Electronically Signed     WB/MEDQ  D:  06/30/2008  T:  06/30/2008   Job:  [id number]   cc:   Telford Nab, R.N.  [address]  [place], [place] [address]   Richard A. Alanda Amass, M.D.  Fax: [phone]   Hassell Done  Fax: [phone]

## 2010-07-03 NOTE — Consult Note (Signed)
NAMEGERLINE, RATTO NO.:  [id number]   MEDICAL RECORD NO.:  [id number]          PATIENT TYPE:  OUT   LOCATION:  GYN                          FACILITY:  [facility]   PHYSICIAN:  Laurette Schimke, MD     DATE OF BIRTH:  02-Dec-1919   DATE OF CONSULTATION:  08/25/2008  DATE OF DISCHARGE:                                 CONSULTATION   REASON FOR VISIT:  Postoperative check.   HISTORY OF PRESENT ILLNESS:  This is an 77 year old who was diagnosed  with a grade 1 endometrial adenocarcinoma by endometrial biopsy on June 16, 2008.  On Jul 14, 2008 she underwent a laparoscopic hysterectomy and  bilateral salpingo-oophorectomy, omental excision, bilateral pelvic  lymph node sampling.  Final pathology was notable for a grade 1  endometrial adenocarcinoma involving the cervical stroma. Tumor measured  3.8 cm in greatest dimension and invaded 7 of 15 mm of uterine wall.  No  lymph or vascular space invasion identified, and pelvic washings were  negative.  As such she is a stage II grade 1 endometrial adenocarcinoma.  At this visit, she reports a recent fall in which she hit her left cheek  and bilateral knees.  She reports some weakness of her legs.  Otherwise,  she denies any vaginal bleeding, nausea or vomiting.  There is no chest  pain, chest pressure.  Otherwise review of systems is negative.   PAST MEDICAL HISTORY:  No additional changes.   PAST SURGICAL HISTORY:  No additional changes.   SOCIAL HISTORY:  Ms. Puccini lives across the street from her son.  Her  children are very well involved in her life.  She denies tobacco or  alcohol use.   PHYSICAL EXAMINATION:  GENERAL: A well-developed female in no acute  distress.  VITAL SIGNS: Blood pressure 130/68.  CHEST: Clear to auscultation bilaterally.  SKIN:  Ecchymosis of the left side of the chin is appreciated.  There is  ecchymosis on bilateral knees.  ABDOMEN: Soft and nontender.  All incisions are well healed  without any  hernia or tenderness.  GENITOURINARY:  Inspection of the vagina is notable for a well-healed  cuff.  On bimanual examination no masses were noted within the cul-de-  sac.   IMPRESSION:  Stage II grade 1 endometrial adenocarcinoma.  I have  recommended to Ms. Spraker referral for vaginal cuff brachytherapy, and  I have asked her to follow up in 2 months.      Laurette Schimke, MD  Electronically Signed     WB/MEDQ  D:  08/25/2008  T:  08/25/2008  Job:  [id number]   cc:   Hassell Done  Fax: [phone]   Telford Nab, R.N.  [phone] [address]  [place], [place] [address]

## 2010-07-06 NOTE — H&P (Signed)
Jasmin Beck, MENON NO.:  [id number]   MEDICAL RECORD NO.:  [id number]          PATIENT TYPE:  IPS   LOCATION:  4030                         FACILITY:  [facility]   PHYSICIAN:  Erick Colace, M.D.DATE OF BIRTH:  04-02-1919   DATE OF ADMISSION:  01/25/2005  DATE OF DISCHARGE:                                HISTORY & PHYSICAL   REASON FOR ADMISSION:  Decreased self care mobility skills, following left  femoral neck fracture.   HISTORY:  An 77 year old female with a history of breast carcinoma and  hypertension was admitted, on January 22, 2005, with a fall injuring left  hip.  She had no loss of consciousness.  X-rays revealed left femoral neck  fracture.  She underwent bipolar hemiarthroplasty, on January 22, 2005, by  Dr. Ranell Patrick from orthopedics.  She was placed on Coumadin for DVT prophylaxis  and made weightbearing as tolerated.  Post-op anemia with a hemoglobin of  8.6, she was transfused two units packed red blood cells, on January 24, 2005, and her hemoglobin went up to 11.1.  She has had negative chest pain,  negative nausea, vomiting.  Blood pressure has been on the low side,  therefore, Cozaar and hydrochlorothiazide held, December 2006, per internal  medicine hospitalist.   REVIEW OF SYSTEMS:  Positive for reflux, lumbago, anxiety and depression.   PAST HISTORY:  1.  Mild dementia, however, she has been able to live by herself and be      quite active and take care of her house.  2.  She has had hyperlipidemia.  3.  History of syncope.  4.  Peripheral neuropathy.  5.  Essential tremor.  6.  GERD.  In addition to above.   FAMILY HISTORY:  Positive for hypertension and coronary artery disease.   SOCIAL HISTORY:  Lives alone in [place].  Son and family work days but  live across the street.  One level home, two steps to enter.   FUNCTIONAL HISTORY:  Independent with a cane and family driving.   FUNCTIONAL STATUS:  Needs assistance with  ADLs and mobility.   HOME MEDICATIONS:  1.  Prevacid 30 mg p.o. every day.  2.  Ferrous sulfate 325 mg p.o. every day.  3.  Allegra 180 mg p.o. every day.  4.  Lipitor 10 mg p.o. every day.  5.  Vitamin E one tablet every day.  6.  Hyzaar 100 mg p.o. every day.  7.  Aspirin 81 mg p.o. every day.  8.  Norvasc 5 mg one half p.o. every day.  9.  Zoloft 50 mg p.o. q.h.s.  10. Plavix 75 mg p.o. every day.  11. Aricept 5 mg p.o. every day.   Last BUN 9, creatinine 0.8.  INR 1.3.   CURRENT MEDICATIONS:  1.  Lipitor 10 mg p.o. every day, same.  2.  Zoloft 50 mg p.o. every day, same.  3.  Protonix 40 mg p.o. every day, substitution for Prevacid.  4.  Aricept 10 mg p.o. q.h.s., increased dose.  5.  Aspirin 81 mg p.o. every day, same.  6.  Lovenox subcu 30 mg subcu q.12h. till INR greater than 2, new.  7.  Norvasc 2.5 mg p.o. every day, same.  8.  Plavix 75 mg p.o. every day, same.  9.  Fibercon 625 p.o. every day.  10. Warfarin per pharmacy protocol.  11. Ferrous sulfate 325 mg p.o. b.i.d.  12. Vitamin E 800 units p.o. every day.  13. Robaxin 500 mg p.o. q.6h.   PHYSICAL EXAMINATION:  GENERAL:  No acute distress.  Mood and affect  appropriate.  Family visiting.  VITAL SIGNS:  Blood pressure 142/69, pulse 80, respirations 18, temp 98.9.  HEENT:  Eyes anicteric, not injected.  External ENT normal.  NECK:  Supple without adenopathy.  PULMONARY:  Respiratory effort is good.  Lungs are clear.  HEART:  Regular rate and rhythm.  No murmurs, or extra sounds.  ABDOMEN:  Positive bowel sounds.  Soft, nontender to palpation.  EXTREMITIES:  No clubbing, cyanosis, or edema.  Motor strength is 5- over 5  bilateral deltoid, biceps, triceps, grip, 4+ in the right hip flexor, knee  extensor, ankle dorsiflexor, 1 in the left hip flexor, knee extensor can not  be tested secondary to knee immobilizer.  Ankle dorsiflexor, plantar flexor  4/5.  Left hip incision has Steri-Strips, some dried blood, no  evidence of  erythema, a small amount of bright red drainage, i.e., one drop superior  aspect at the incision.  She has decreased sensation of the feet.  NEUROLOGIC:  Orientation x3.   IMPRESSION:  1.  Left femoral neck fracture, bipolar hemiarthroplasty.  2.  Pain management on Vicodin Robaxin.  3.  Deep vein thrombosis prophylaxis on Coumadin.  4.  Postoperative anemia, followup CBC.  5.  Hyperlipidemia on Lipitor.  6.  Hypertension on Cozaar and hydrochlorothiazide both held due to      hypotension and azotemia  .  7.  Mild dementia on Aricept.  8.  Gastroesophageal reflux disease on Protonix.  9.  Depression on Zoloft.  10. Essential tremor, monitor.   We will try physical therapy modalities.   ESTIMATED LENGTH OF STAY:  Is   Dictation ended at this point.      Erick Colace, M.D.  Electronically Signed     AEK/MEDQ  D:  01/25/2005  T:  01/25/2005  Job:  [id number]   cc:   Tammi Sou. Langston Masker, M.D.  [address]  [place], [place] [address]

## 2010-07-06 NOTE — Discharge Summary (Signed)
[name], Beck NO.:  [id number]   MEDICAL RECORD NO.:  [id number]          PATIENT TYPE:  INP   LOCATION:  1238                         FACILITY:  [facility]   PHYSICIAN:  Roseanna Rainbow, M.D.DATE OF BIRTH:  02/11/1920   DATE OF ADMISSION:  07/12/2008  DATE OF DISCHARGE:  07/13/2008                               DISCHARGE SUMMARY   CHIEF COMPLAINT:  The patient is an 77 year old para 2 with a newly-  diagnosed grade 1 endometrioid adenocarcinoma who presents for operative  management.  Please see the dictated history and physical as per Dr.  Laurette Schimke.   HOSPITAL COURSE:  The patient was admitted and underwent a total  laparoscopic hysterectomy, bilateral salpingo-oophorectomy and bilateral  pelvic lymphadenectomies and lysis of adhesions.  Please see the  dictated operative summary.  On postoperative day 1, hemoglobin was 9.  A basic metabolic profile was remarkable for sodium of 124.  The labs  were repeated on postop day 1, and the repeat lab work showed a  hemoglobin of 9.1 and a stable sodium of 126.  She was tolerating a  regular diet and was discharged to home.   DISCHARGE DIAGNOSES:  1. Endometrioid adenocarcinoma, grade 1, uterine, stage II.  2. Hyponatremia.  3. Anemia.   PROCEDURES:  Total laparoscopic hysterectomy, bilateral salpingo-  oophorectomy, bilateral pelvic lymph node dissection and lysis of  adhesions.   CONDITION:  Stable.   DIET:  Regular.  She was counseled to limit free water consumption.   ACTIVITY:  Progressive activity, pelvic rest.   MEDICATIONS:  She was to resume her preoperative medications which  included Synthroid, fexofenadine, Lipitor, vitamin E, multivitamin,  aspirin, Aricept, sertraline hydrochloride, naproxen sodium, Plavix,  Prevacid, iron sulfate, Diovan.   DISPOSITION:  She is to follow up in the GYN oncology office in several  days to repeat an electrolyte panel.      Roseanna Rainbow, M.D.  Electronically Signed     LAJ/MEDQ  D:  08/02/2008  T:  08/02/2008  Job:  [id number]   cc:   Telford Nab, R.N.  [address]  [place], [place] [address]   Richard A. Alanda Amass, M.D.  Fax: [phone]   Hassell Done, MD, fx [phone]

## 2010-10-03 ENCOUNTER — Ambulatory Visit: Payer: Medicare Other | Attending: Radiation Oncology | Admitting: Radiation Oncology

## 2010-11-07 ENCOUNTER — Other Ambulatory Visit: Payer: Self-pay | Admitting: Radiation Oncology

## 2010-11-07 ENCOUNTER — Ambulatory Visit
Admission: RE | Admit: 2010-11-07 | Discharge: 2010-11-07 | Disposition: A | Discharge status: Home / Self Care | Payer: Medicare Other | Source: Ambulatory Visit | Attending: Radiation Oncology | Admitting: Radiation Oncology

## 2010-11-07 ENCOUNTER — Other Ambulatory Visit (HOSPITAL_COMMUNITY)
Admission: RE | Admit: 2010-11-07 | Discharge: 2010-11-07 | Disposition: A | Discharge status: Home / Self Care | Payer: Medicare Other | Source: Ambulatory Visit | Attending: Radiation Oncology | Admitting: Radiation Oncology

## 2010-11-07 DIAGNOSIS — C549 Malignant neoplasm of corpus uteri, unspecified: Secondary | ICD-10-CM | POA: Insufficient documentation

## 2011-02-19 ENCOUNTER — Inpatient Hospital Stay (HOSPITAL_COMMUNITY)
Admission: EM | Admit: 2011-02-19 | Discharge: 2011-02-22 | DRG: 482 | Disposition: A | Discharge status: Skilled Nursing Facility | Payer: Medicare Other | Source: Other Acute Inpatient Hospital | Attending: Orthopedic Surgery | Admitting: Orthopedic Surgery

## 2011-02-19 ENCOUNTER — Encounter (HOSPITAL_COMMUNITY): Payer: Self-pay | Admitting: Orthopedic Surgery

## 2011-02-19 ENCOUNTER — Other Ambulatory Visit: Payer: Self-pay

## 2011-02-19 DIAGNOSIS — E039 Hypothyroidism, unspecified: Secondary | ICD-10-CM | POA: Diagnosis present

## 2011-02-19 DIAGNOSIS — Y9301 Activity, walking, marching and hiking: Secondary | ICD-10-CM

## 2011-02-19 DIAGNOSIS — E785 Hyperlipidemia, unspecified: Secondary | ICD-10-CM | POA: Diagnosis present

## 2011-02-19 DIAGNOSIS — S72009A Fracture of unspecified part of neck of unspecified femur, initial encounter for closed fracture: Principal | ICD-10-CM | POA: Diagnosis present

## 2011-02-19 DIAGNOSIS — Z79899 Other long term (current) drug therapy: Secondary | ICD-10-CM

## 2011-02-19 DIAGNOSIS — Z7982 Long term (current) use of aspirin: Secondary | ICD-10-CM

## 2011-02-19 DIAGNOSIS — M129 Arthropathy, unspecified: Secondary | ICD-10-CM | POA: Diagnosis present

## 2011-02-19 DIAGNOSIS — S72001A Fracture of unspecified part of neck of right femur, initial encounter for closed fracture: Secondary | ICD-10-CM | POA: Diagnosis present

## 2011-02-19 DIAGNOSIS — Z853 Personal history of malignant neoplasm of breast: Secondary | ICD-10-CM

## 2011-02-19 DIAGNOSIS — W19XXXA Unspecified fall, initial encounter: Secondary | ICD-10-CM | POA: Diagnosis present

## 2011-02-19 DIAGNOSIS — F29 Unspecified psychosis not due to a substance or known physiological condition: Secondary | ICD-10-CM | POA: Diagnosis present

## 2011-02-19 DIAGNOSIS — Y92009 Unspecified place in unspecified non-institutional (private) residence as the place of occurrence of the external cause: Secondary | ICD-10-CM

## 2011-02-19 DIAGNOSIS — Y998 Other external cause status: Secondary | ICD-10-CM

## 2011-02-19 HISTORY — DX: Hypothyroidism, unspecified: E03.9

## 2011-02-19 HISTORY — DX: Essential (primary) hypertension: I10

## 2011-02-19 LAB — DIFFERENTIAL
Basophils Absolute: 0 10*3/uL (ref 0.0–0.1)
Eosinophils Relative: 0 % (ref 0–5)
Lymphocytes Relative: 3 % — ABNORMAL LOW (ref 12–46)
Lymphs Abs: 0.3 10*3/uL — ABNORMAL LOW (ref 0.7–4.0)
Monocytes Absolute: 0.5 10*3/uL (ref 0.1–1.0)
Neutro Abs: 7.7 10*3/uL (ref 1.7–7.7)

## 2011-02-19 LAB — BASIC METABOLIC PANEL
BUN: 21 mg/dL (ref 6–23)
CO2: 27 mEq/L (ref 19–32)
Calcium: 8.8 mg/dL (ref 8.4–10.5)
Chloride: 96 mEq/L (ref 96–112)
Creatinine, Ser: 0.84 mg/dL (ref 0.50–1.10)
GFR calc Af Amer: 69 mL/min — ABNORMAL LOW (ref >90)
GFR calc non Af Amer: 60 mL/min — ABNORMAL LOW (ref >90)
Sodium: 133 mEq/L — ABNORMAL LOW (ref 135–145)

## 2011-02-19 LAB — CBC
HCT: 37.9 % (ref 36.0–46.0)
MCV: 88.1 fL (ref 78.0–100.0)
RBC: 4.3 MIL/uL (ref 3.87–5.11)
RDW: 13.7 % (ref 11.5–15.5)
WBC: 8.5 10*3/uL (ref 4.0–10.5)

## 2011-02-19 LAB — APTT: aPTT: 38 seconds — ABNORMAL HIGH (ref 24–37)

## 2011-02-19 LAB — URINALYSIS, ROUTINE W REFLEX MICROSCOPIC
Bilirubin Urine: NEGATIVE
Glucose, UA: NEGATIVE mg/dL
Hgb urine dipstick: NEGATIVE
Ketones, ur: NEGATIVE mg/dL
pH: 5.5 (ref 5.0–8.0)

## 2011-02-19 LAB — URINE MICROSCOPIC-ADD ON

## 2011-02-19 LAB — TYPE AND SCREEN

## 2011-02-19 MED ORDER — AMLODIPINE BESYLATE 10 MG PO TABS
10.0000 mg | ORAL_TABLET | Freq: Every day | ORAL | Status: DC
  Administered 2011-02-19 – 2011-02-22 (×4): 10 mg via ORAL
  Filled 2011-02-19 (×4): qty 1

## 2011-02-19 MED ORDER — ACETAMINOPHEN 325 MG PO TABS
325.0000 mg | ORAL_TABLET | Freq: Four times a day (QID) | ORAL | Status: DC | PRN
  Administered 2011-02-19: 650 mg via ORAL
  Filled 2011-02-19: qty 2

## 2011-02-19 MED ORDER — LEVOTHYROXINE SODIUM 75 MCG PO TABS
75.0000 ug | ORAL_TABLET | Freq: Every day | ORAL | Status: DC
  Administered 2011-02-19 – 2011-02-22 (×4): 75 ug via ORAL
  Filled 2011-02-19 (×4): qty 1

## 2011-02-19 MED ORDER — CLOPIDOGREL BISULFATE 75 MG PO TABS
75.0000 mg | ORAL_TABLET | Freq: Every day | ORAL | Status: DC
  Administered 2011-02-19 – 2011-02-21 (×3): 75 mg via ORAL
  Filled 2011-02-19 (×4): qty 1

## 2011-02-19 MED ORDER — FERROUS FUMARATE 325 (106 FE) MG PO TABS
1.0000 | ORAL_TABLET | Freq: Every day | ORAL | Status: DC
Start: 2011-02-19 — End: 2011-02-19
  Filled 2011-02-19: qty 1

## 2011-02-19 MED ORDER — SIMVASTATIN 40 MG PO TABS
40.0000 mg | ORAL_TABLET | Freq: Every day | ORAL | Status: DC
  Filled 2011-02-19: qty 1

## 2011-02-19 MED ORDER — CHLORHEXIDINE GLUCONATE 4 % EX LIQD
60.0000 mL | Freq: Once | CUTANEOUS | Status: AC
  Administered 2011-02-19: 4 via TOPICAL
  Filled 2011-02-19: qty 60

## 2011-02-19 MED ORDER — DONEPEZIL HCL 10 MG PO TABS
10.0000 mg | ORAL_TABLET | Freq: Every day | ORAL | Status: DC
  Administered 2011-02-19 – 2011-02-22 (×4): 10 mg via ORAL
  Filled 2011-02-19 (×4): qty 1

## 2011-02-19 MED ORDER — ENOXAPARIN SODIUM 40 MG/0.4ML ~~LOC~~ SOLN
40.0000 mg | Freq: Once | SUBCUTANEOUS | Status: AC
  Administered 2011-02-19: 40 mg via SUBCUTANEOUS
  Filled 2011-02-19: qty 0.4

## 2011-02-19 MED ORDER — DARIFENACIN HYDROBROMIDE ER 7.5 MG PO TB24
7.5000 mg | ORAL_TABLET | Freq: Every day | ORAL | Status: DC
  Administered 2011-02-19 – 2011-02-21 (×3): 7.5 mg via ORAL
  Filled 2011-02-19 (×4): qty 1

## 2011-02-19 MED ORDER — ASPIRIN EC 81 MG PO TBEC
81.0000 mg | DELAYED_RELEASE_TABLET | Freq: Every day | ORAL | Status: DC
  Administered 2011-02-21 – 2011-02-22 (×2): 81 mg via ORAL
  Filled 2011-02-19 (×4): qty 1

## 2011-02-19 MED ORDER — FERROUS FUMARATE 325 (106 FE) MG PO TABS
1.0000 | ORAL_TABLET | Freq: Every day | ORAL | Status: DC
  Administered 2011-02-19 – 2011-02-21 (×3): 106 mg via ORAL
  Filled 2011-02-19 (×5): qty 1

## 2011-02-19 MED ORDER — SULINDAC 200 MG PO TABS
200.0000 mg | ORAL_TABLET | Freq: Every day | ORAL | Status: DC
  Administered 2011-02-19 – 2011-02-22 (×4): 200 mg via ORAL
  Filled 2011-02-19 (×5): qty 1

## 2011-02-19 MED ORDER — VITAMIN E 180 MG (400 UNIT) PO CAPS
800.0000 [IU] | ORAL_CAPSULE | Freq: Every day | ORAL | Status: DC
  Administered 2011-02-19 – 2011-02-22 (×4): 800 [IU] via ORAL
  Filled 2011-02-19 (×4): qty 2

## 2011-02-19 MED ORDER — PANTOPRAZOLE SODIUM 40 MG PO TBEC
40.0000 mg | DELAYED_RELEASE_TABLET | Freq: Every day | ORAL | Status: DC
  Administered 2011-02-20 – 2011-02-21 (×2): 40 mg via ORAL
  Filled 2011-02-19 (×3): qty 1

## 2011-02-19 MED ORDER — CLINDAMYCIN PHOSPHATE 600 MG/50ML IV SOLN
600.0000 mg | INTRAVENOUS | Status: AC
  Administered 2011-02-20: 600 mg via INTRAVENOUS
  Filled 2011-02-19: qty 50

## 2011-02-19 MED ORDER — LORATADINE 10 MG PO TABS
10.0000 mg | ORAL_TABLET | Freq: Every day | ORAL | Status: DC
Start: 2011-02-19 — End: 2011-02-22
  Administered 2011-02-19 – 2011-02-22 (×4): 10 mg via ORAL
  Filled 2011-02-19 (×4): qty 1

## 2011-02-19 MED ORDER — ROSUVASTATIN CALCIUM 10 MG PO TABS
10.0000 mg | ORAL_TABLET | Freq: Every day | ORAL | Status: DC
  Administered 2011-02-19 – 2011-02-21 (×3): 10 mg via ORAL
  Filled 2011-02-19 (×4): qty 1

## 2011-02-19 MED ORDER — SERTRALINE HCL 50 MG PO TABS
50.0000 mg | ORAL_TABLET | Freq: Every day | ORAL | Status: DC
  Administered 2011-02-19 – 2011-02-22 (×4): 50 mg via ORAL
  Filled 2011-02-19 (×4): qty 1

## 2011-02-19 MED ORDER — TERBINAFINE HCL 250 MG PO TABS
250.0000 mg | ORAL_TABLET | Freq: Every day | ORAL | Status: DC
  Administered 2011-02-19 – 2011-02-21 (×3): 250 mg via ORAL
  Filled 2011-02-19 (×5): qty 1

## 2011-02-19 MED ORDER — SODIUM CHLORIDE 0.9 % IV SOLN
INTRAVENOUS | Status: DC
  Administered 2011-02-19 – 2011-02-20 (×2): via INTRAVENOUS
  Filled 2011-02-19 (×7): qty 1000

## 2011-02-19 MED ORDER — OLMESARTAN MEDOXOMIL 40 MG PO TABS
40.0000 mg | ORAL_TABLET | Freq: Every day | ORAL | Status: DC
  Administered 2011-02-19 – 2011-02-22 (×4): 40 mg via ORAL
  Filled 2011-02-19 (×4): qty 1

## 2011-02-19 MED ORDER — PROMETHAZINE HCL 25 MG/ML IJ SOLN
12.5000 mg | Freq: Four times a day (QID) | INTRAMUSCULAR | Status: DC | PRN
  Administered 2011-02-19: 12.5 mg via INTRAVENOUS
  Filled 2011-02-19: qty 1

## 2011-02-19 NOTE — H&P (Signed)
Jasmin Beck is an 78 y.o. female.    Chief Complaint: Right hip fracture / pain  HPI: Pt is a 78 y.o. female complaining of right hip pain. Pt was at home walking circles when she fell and after having lunch. She had instant right hip pain and vomited. She was brought to the ER in [facility], subsequently was transferred to [facility]. Doctor Charlann Boxer was consulted. X-rays reveal right hip fracture.  Various options are discussed with the patient and family. Risks, benefits and expectations were discussed with the patient. Patient understand the risks, benefits and expectations and wishes to proceed with ORIF of the right hip.   PCP:  No primary provider on file.  PMH: Right hip fracture Hypothyroid Hyperlipidemia Arthritis Neuropathy Hx of breast CA  PSH: Breast Mastectomy  Social History:  does not have a smoking history on file. She does not have any smokeless tobacco history on file. Her alcohol and drug histories not on file.  Allergies:  Allergies  Allergen Reactions  . Erythromycin Nausea And Vomiting  . Hydrocodone Other (See Comments)    'out of her head'  . Penicillins Rash  . Sterapred (Prednisone) Other (See Comments)    unknown    Medications: Medications Prior to Admission  Medication Dose Route Frequency Provider Last Rate Last Dose  . acetaminophen (TYLENOL) tablet 325-650 mg  325-650 mg Oral Q6H PRN Genelle Gather Linet Brash, PA      . amLODipine (NORVASC) tablet 10 mg  10 mg Oral Daily Genelle Gather [place], [place]      . aspirin EC tablet 81 mg  81 mg Oral Daily Genelle Gather [place], [place]      . clopidogrel (PLAVIX) tablet 75 mg  75 mg Oral Daily Genelle Gather [place], [place]      . darifenacin (ENABLEX) 24 hr tablet 7.5 mg  7.5 mg Oral Daily Genelle Gather [place], [place]      . enoxaparin (LOVENOX) injection 40 mg  40 mg Subcutaneous Once Genelle Gather Chaya Dehaan, PA      . ferrous fumarate (HEMOCYTE - 106 mg FE) tablet 106 mg of iron  1 tablet Oral Daily Genelle Gather [place], [place]      . levothyroxine (SYNTHROID, LEVOTHROID) tablet 75 mcg  75 mcg Oral Daily Genelle Gather [place], [place]      . loratadine (CLARITIN) tablet 10 mg  10 mg Oral Daily Genelle Gather [place], [place]      . olmesartan (BENICAR) tablet 40 mg  40 mg Oral Daily Genelle Gather [place], [place]      . pantoprazole (PROTONIX) EC tablet 40 mg  40 mg Oral Daily Genelle Gather [place], [place]      . promethazine (PHENERGAN) injection 12.5 mg  12.5 mg Intravenous Q6H PRN Genelle Gather Akima Slaugh, PA      . simvastatin (ZOCOR) tablet 40 mg  40 mg Oral Daily Genelle Gather [place], [place]      . sulindac (CLINORIL) tablet 200 mg  200 mg Oral Daily Genelle Gather [place], [place]      . terbinafine (LAMISIL) tablet 250 mg  250 mg Oral QHS Genelle Gather [place], [place]      . vitamin E capsule 800 Units  800 Units Oral Daily Genelle Gather [place], [place]       No current outpatient prescriptions on file as of 02/19/2011.     ROS: Review of Systems  Constitutional: Negative.   HENT: Negative.   Eyes: Negative.   Respiratory: Negative.   Cardiovascular: Negative.  Gastrointestinal: Positive for nausea and vomiting.  Genitourinary: Positive for frequency.  Musculoskeletal: Positive for joint pain.  Skin: Negative.   Neurological: Negative.   Endo/Heme/Allergies: Negative.   Psychiatric/Behavioral: Positive for memory loss.     Physican Exam: Blood pressure 114/68, pulse 73, temperature 98.8 F (37.1 C), temperature source Oral, resp. rate 16, height 5\' 3"  (1.6 m), weight 65.772 kg (145 lb). Physical Exam  Constitutional: She is well-developed, well-nourished, and in no distress.  HENT:  Head: Normocephalic.  Eyes: Pupils are equal, round, and reactive to light.  Neck: Neck supple. No JVD present. No tracheal deviation present.  Cardiovascular: Normal rate and normal heart sounds.   Pulmonary/Chest: Effort normal and breath sounds normal.  Abdominal: Soft. There is no tenderness. There is no guarding.  Musculoskeletal:        Right hip: She exhibits decreased range of motion, decreased strength, tenderness and bony tenderness.  Lymphadenopathy:    She has no cervical adenopathy.  Skin: Skin is warm and dry.   exam   Assessment/Plan Assessment: Right hip fracture / pain  Plan: Patient will undergo a percutaneous cannulated screw fixation of the right hip on 02/20/11. Risks benefits and expectation were discussed with the patient. Patient understand risks, benefits and expectation and wishes to proceed.   Anastasio Auerbach Sravya Grissom   PAC  02/19/2011, 7:55 AM

## 2011-02-19 NOTE — Progress Notes (Signed)
PHARMACIST - PHYSICIAN COMMUNICATION  DESCRIPTION:   Patients on diltiazem/amlodipine and simvastatin > 10 mg/day have reported cases of rhabdomyolysis.  Per P&T protocol, pharmacy will substitute all simvastatin > 10 mg with rosuvastatin (Crestor) 1mg  for each 4 mg of simvastatin.  Please be advise of this change inpatient and at discharge if applicable.  Thank you.    If you have questions about this conversion, please contact the Pharmacy Department  []   [phone] )  [facility] []   [phone] )  [facility]  []   [phone] )  [facility] [x]   [phone] )  [facility]

## 2011-02-20 ENCOUNTER — Encounter (HOSPITAL_COMMUNITY)
Admission: EM | Disposition: A | Discharge status: Skilled Nursing Facility | Payer: Self-pay | Source: Other Acute Inpatient Hospital | Attending: Orthopedic Surgery

## 2011-02-20 ENCOUNTER — Inpatient Hospital Stay (HOSPITAL_COMMUNITY): Payer: Medicare Other | Admitting: Anesthesiology

## 2011-02-20 ENCOUNTER — Inpatient Hospital Stay (HOSPITAL_COMMUNITY): Payer: Medicare Other

## 2011-02-20 ENCOUNTER — Encounter (HOSPITAL_COMMUNITY): Payer: Self-pay | Admitting: Anesthesiology

## 2011-02-20 HISTORY — PX: HIP PINNING,CANNULATED: SHX1758

## 2011-02-20 SURGERY — FIXATION, FEMUR, NECK, PERCUTANEOUS, USING SCREW
Anesthesia: General | Site: Hip | Laterality: Right | Wound class: Clean

## 2011-02-20 SURGERY — FIXATION, FEMUR, NECK, PERCUTANEOUS, USING SCREW
Anesthesia: General | Laterality: Right

## 2011-02-20 MED ORDER — ONDANSETRON HCL 4 MG/2ML IJ SOLN
INTRAMUSCULAR | Status: DC | PRN
  Administered 2011-02-20: 4 mg via INTRAVENOUS

## 2011-02-20 MED ORDER — ROCURONIUM BROMIDE 100 MG/10ML IV SOLN
INTRAVENOUS | Status: DC | PRN
  Administered 2011-02-20: 30 mg via INTRAVENOUS

## 2011-02-20 MED ORDER — ACETAMINOPHEN 650 MG RE SUPP: 650.0000 mg | Freq: Four times a day (QID) | RECTAL | Status: DC | PRN

## 2011-02-20 MED ORDER — DEXTROSE 5 % IV SOLN
500.0000 mg | Freq: Four times a day (QID) | INTRAVENOUS | Status: DC | PRN
  Administered 2011-02-21: 500 mg via INTRAVENOUS
  Filled 2011-02-20: qty 5

## 2011-02-20 MED ORDER — PROPOFOL 10 MG/ML IV BOLUS
INTRAVENOUS | Status: DC | PRN
  Administered 2011-02-20: 120 mg via INTRAVENOUS

## 2011-02-20 MED ORDER — MENTHOL 3 MG MT LOZG: 1.0000 | LOZENGE | OROMUCOSAL | Status: DC | PRN

## 2011-02-20 MED ORDER — HYDROMORPHONE HCL PF 1 MG/ML IJ SOLN
0.2500 mg | INTRAMUSCULAR | Status: DC | PRN
  Administered 2011-02-20 – 2011-02-21 (×2): 0.5 mg via INTRAVENOUS
  Filled 2011-02-20 (×2): qty 1

## 2011-02-20 MED ORDER — SODIUM CHLORIDE 0.9 % IV SOLN
INTRAVENOUS | Status: DC
  Administered 2011-02-20 – 2011-02-22 (×3): via INTRAVENOUS
  Filled 2011-02-20 (×8): qty 1000

## 2011-02-20 MED ORDER — ENOXAPARIN SODIUM 40 MG/0.4ML ~~LOC~~ SOLN
40.0000 mg | SUBCUTANEOUS | Status: DC
  Administered 2011-02-21 – 2011-02-22 (×2): 40 mg via SUBCUTANEOUS
  Filled 2011-02-20 (×3): qty 0.4

## 2011-02-20 MED ORDER — PHENOL 1.4 % MT LIQD: 1.0000 | OROMUCOSAL | Status: DC | PRN

## 2011-02-20 MED ORDER — PROMETHAZINE HCL 25 MG/ML IJ SOLN: 6.2500 mg | INTRAMUSCULAR | Status: DC | PRN

## 2011-02-20 MED ORDER — METOCLOPRAMIDE HCL 5 MG/ML IJ SOLN
5.0000 mg | Freq: Three times a day (TID) | INTRAMUSCULAR | Status: DC | PRN
Start: 2011-02-20 — End: 2011-02-22

## 2011-02-20 MED ORDER — METOCLOPRAMIDE HCL 10 MG PO TABS
5.0000 mg | ORAL_TABLET | Freq: Three times a day (TID) | ORAL | Status: DC | PRN
Start: 2011-02-20 — End: 2011-02-22

## 2011-02-20 MED ORDER — ACETAMINOPHEN 325 MG PO TABS
650.0000 mg | ORAL_TABLET | Freq: Four times a day (QID) | ORAL | Status: DC | PRN
  Administered 2011-02-21 – 2011-02-22 (×2): 650 mg via ORAL
  Filled 2011-02-20 (×3): qty 2

## 2011-02-20 MED ORDER — LACTATED RINGERS IV SOLN
INTRAVENOUS | Status: DC
  Administered 2011-02-20: 1000 mL via INTRAVENOUS

## 2011-02-20 MED ORDER — LACTATED RINGERS IV SOLN: INTRAVENOUS | Status: DC

## 2011-02-20 MED ORDER — METHOCARBAMOL 500 MG PO TABS
500.0000 mg | ORAL_TABLET | Freq: Four times a day (QID) | ORAL | Status: DC | PRN
  Administered 2011-02-21 – 2011-02-22 (×2): 500 mg via ORAL
  Filled 2011-02-20 (×2): qty 1

## 2011-02-20 MED ORDER — FENTANYL CITRATE 0.05 MG/ML IJ SOLN
INTRAMUSCULAR | Status: DC | PRN
  Administered 2011-02-20 (×4): 25 ug via INTRAVENOUS

## 2011-02-20 MED ORDER — ALUM & MAG HYDROXIDE-SIMETH 200-200-20 MG/5ML PO SUSP: 30.0000 mL | ORAL | Status: DC | PRN

## 2011-02-20 MED ORDER — LIDOCAINE HCL (CARDIAC) 20 MG/ML IV SOLN
INTRAVENOUS | Status: DC | PRN
  Administered 2011-02-20: 50 mg via INTRAVENOUS

## 2011-02-20 MED ORDER — DOCUSATE SODIUM 100 MG PO CAPS
100.0000 mg | ORAL_CAPSULE | Freq: Two times a day (BID) | ORAL | Status: DC
  Administered 2011-02-20 – 2011-02-22 (×4): 100 mg via ORAL
  Filled 2011-02-20 (×5): qty 1

## 2011-02-20 MED ORDER — ONDANSETRON HCL 4 MG/2ML IJ SOLN: 4.0000 mg | Freq: Four times a day (QID) | INTRAMUSCULAR | Status: DC | PRN

## 2011-02-20 MED ORDER — POLYETHYLENE GLYCOL 3350 17 G PO PACK
17.0000 g | PACK | Freq: Every day | ORAL | Status: DC | PRN
  Filled 2011-02-20: qty 1

## 2011-02-20 MED ORDER — ONDANSETRON HCL 4 MG PO TABS: 4.0000 mg | ORAL_TABLET | Freq: Four times a day (QID) | ORAL | Status: DC | PRN

## 2011-02-20 SURGICAL SUPPLY — 39 items
BAG ZIPLOCK 12X15 (MISCELLANEOUS) ×2 IMPLANT
BNDG COHESIVE 4X5 TAN STRL (GAUZE/BANDAGES/DRESSINGS) ×2 IMPLANT
CLOTH BEACON ORANGE TIMEOUT ST (SAFETY) ×2 IMPLANT
DERMABOND ADVANCED (GAUZE/BANDAGES/DRESSINGS) ×1
DERMABOND ADVANCED .7 DNX12 (GAUZE/BANDAGES/DRESSINGS) ×1 IMPLANT
DRAPE SURG 17X11 SM STRL (DRAPES) ×2 IMPLANT
DRSG AQUACEL AG ADV 3.5X 6 (GAUZE/BANDAGES/DRESSINGS) ×2 IMPLANT
DRSG EMULSION OIL 3X16 NADH (GAUZE/BANDAGES/DRESSINGS) IMPLANT
DRSG PAD ABDOMINAL 8X10 ST (GAUZE/BANDAGES/DRESSINGS) IMPLANT
DURAPREP 26ML APPLICATOR (WOUND CARE) ×2 IMPLANT
ELECT REM PT RETURN 9FT ADLT (ELECTROSURGICAL) ×2
ELECTRODE REM PT RTRN 9FT ADLT (ELECTROSURGICAL) ×1 IMPLANT
GLOVE BIOGEL PI IND STRL 7.5 (GLOVE) ×1 IMPLANT
GLOVE BIOGEL PI IND STRL 8 (GLOVE) ×1 IMPLANT
GLOVE BIOGEL PI INDICATOR 7.5 (GLOVE) ×1
GLOVE BIOGEL PI INDICATOR 8 (GLOVE) ×1
GLOVE ECLIPSE 8.0 STRL XLNG CF (GLOVE) IMPLANT
GLOVE ORTHO TXT STRL SZ7.5 (GLOVE) ×4 IMPLANT
GLOVE SURG ORTHO 8.0 STRL STRW (GLOVE) ×2 IMPLANT
GOWN STRL NON-REIN LRG LVL3 (GOWN DISPOSABLE) ×2 IMPLANT
GUIDEWIRE THREADED 2.8 (WIRE) ×8 IMPLANT
MANIFOLD NEPTUNE II (INSTRUMENTS) IMPLANT
NS IRRIG 1000ML POUR BTL (IV SOLUTION) ×2 IMPLANT
PACK GENERAL/GYN (CUSTOM PROCEDURE TRAY) ×2 IMPLANT
PAD CAST 4YDX4 CTTN HI CHSV (CAST SUPPLIES) ×1 IMPLANT
PADDING CAST COTTON 4X4 STRL (CAST SUPPLIES) ×1
PADDING CAST COTTON 6X4 STRL (CAST SUPPLIES) ×2 IMPLANT
POSITIONER SURGICAL ARM (MISCELLANEOUS) ×2 IMPLANT
SCREW CANN 16 THRD/105 7.3 (Screw) ×2 IMPLANT
SCREW CANN 16 THRD/75 7.3 (Screw) ×2 IMPLANT
SCREW CANN 16 THRD/80 7.3 (Screw) ×2 IMPLANT
SCREW CANN 16 THRD/90 7.3 (Screw) ×2 IMPLANT
SPONGE GAUZE 4X4 12PLY (GAUZE/BANDAGES/DRESSINGS) IMPLANT
STAPLER VISISTAT 35W (STAPLE) ×2 IMPLANT
SUT VIC AB 2-0 CT1 27 (SUTURE) ×4
SUT VIC AB 2-0 CT1 TAPERPNT 27 (SUTURE) ×2 IMPLANT
TOWEL OR 17X26 10 PK STRL BLUE (TOWEL DISPOSABLE) ×4 IMPLANT
TRAY FOLEY CATH 14FRSI W/METER (CATHETERS) IMPLANT
WATER STERILE IRR 1500ML POUR (IV SOLUTION) ×2 IMPLANT

## 2011-02-20 NOTE — Transfer of Care (Signed)
Immediate Anesthesia Transfer of Care Note  Patient: Jasmin Beck  Procedure(s) Performed:  CANNULATED HIP PINNING - percutaneous cannulated screw fixation right hip  Patient Location: PACU  Anesthesia Type: General  Level of Consciousness: sedated, patient cooperative and responds to stimulaton  Airway & Oxygen Therapy: Patient Spontanous Breathing and Patient connected to face mask oxgen  Post-op Assessment: Report given to PACU RN and Post -op Vital signs reviewed and stable  Post vital signs: Reviewed and stable  Complications: No apparent anesthesia complications

## 2011-02-20 NOTE — Progress Notes (Signed)
Subjective:   Procedure(s) (LRB): CANNULATED HIP PINNING (Right)   Patient reports pain as mild. No events. Confusion noted when talking with pt.  Objective:   VITALS:   Filed Vitals:   02/20/11 0530  BP: 175/76  Pulse: 78  Temp: 99 F (37.2 C)  Resp: 16    Neurovascular intact Dorsiflexion/Plantar flexion intact No cellulitis present Compartment soft  LABS  Basename 02/19/11 1035  HGB 13.5  HCT 37.9  WBC 8.5  PLT 212     Basename 02/19/11 1035  NA 133*  K 3.6  BUN 21  CREATININE 0.84  GLUCOSE 163*     Assessment/Plan:   Procedure(s) (LRB): CANNULATED HIP PINNING (Right)  NPO since midnight, plan is for surgery today around 1300 She will stay bedrest until that time   Constanza Mincy S. Tildon Silveria   PAC  02/20/2011, 9:05 AM   

## 2011-02-20 NOTE — Brief Op Note (Signed)
02/19/2011 - 02/20/2011  2:12 PM  PATIENT:  Jasmin Beck  78 y.o. female  PRE-OPERATIVE DIAGNOSIS:  right femoral neck fracture, non-displaced, valgus impacted  POST-OPERATIVE DIAGNOSIS:  fractured right femoral neck, non-displaced, valgus impacted    PROCEDURE:  Procedure(s): CANNULATED HIP PINNING, RIGHT HIP  SURGEON:  Surgeon(s): [name]  PHYSICIAN ASSISTANT: None  ANESTHESIA:   general  EBL:  Total I/O In: 200 [I.V.:200] Out: 1250 [Urine:1250]  BLOOD ADMINISTERED:none  DRAINS: none   LOCAL MEDICATIONS USED:  NONE  SPECIMEN:  No Specimen  DISPOSITION OF SPECIMEN:  N/A  COUNTS:  YES  TOURNIQUET:  * No tourniquets in log *  DICTATION: .Other Dictation: Dictation Number [id number]  PLAN OF CARE: Admit to inpatient   PATIENT DISPOSITION:  PACU - hemodynamically stable.   Delay start of Pharmacological VTE agent (>24hrs) due to surgical blood loss or risk of bleeding:  {YES/NO/NOT APPLICABLE:20182

## 2011-02-20 NOTE — H&P (View-Only) (Signed)
Subjective:   Procedure(s) (LRB): CANNULATED HIP PINNING (Right)   Patient reports pain as mild. No events. Confusion noted when talking with pt.  Objective:   VITALS:   Filed Vitals:   02/20/11 0530  BP: 175/76  Pulse: 78  Temp: 99 F (37.2 C)  Resp: 16    Neurovascular intact Dorsiflexion/Plantar flexion intact No cellulitis present Compartment soft  LABS  Basename 02/19/11 1035  HGB 13.5  HCT 37.9  WBC 8.5  PLT 212     Basename 02/19/11 1035  NA 133*  K 3.6  BUN 21  CREATININE 0.84  GLUCOSE 163*     Assessment/Plan:   Procedure(s) (LRB): CANNULATED HIP PINNING (Right)  NPO since midnight, plan is for surgery today around 1300 She will stay bedrest until that time   Anastasio Auerbach. Jasmin Beck   PAC  02/20/2011, 9:05 AM

## 2011-02-20 NOTE — Anesthesia Preprocedure Evaluation (Addendum)
Anesthesia Evaluation  Patient identified by MRN, date of birth, ID band Patient awake    Reviewed: Allergy & Precautions, H&P , NPO status , Patient's Chart, lab work & pertinent test results  History of Anesthesia Complications (+) PONV  Airway Mallampati: II TM Distance: >3 FB Neck ROM: full    Dental  (+) Missing, Poor Dentition and Dental Advisory Given Many missing teeth:   Pulmonary neg pulmonary ROS,  clear to auscultation  Pulmonary exam normal       Cardiovascular Exercise Tolerance: Good hypertension, On Medications and Pt. on medications neg cardio ROS regular Normal    Neuro/Psych PSYCHIATRIC DISORDERS dementiadementia Negative Neurological ROS  Negative Psych ROS   GI/Hepatic negative GI ROS, Neg liver ROS,   Endo/Other  Negative Endocrine ROSHypothyroidism   Renal/GU negative Renal ROS  Genitourinary negative   Musculoskeletal   Abdominal   Peds  Hematology negative hematology ROS (+)   Anesthesia Other Findings Extremely HOH  Reproductive/Obstetrics negative OB ROS                          Anesthesia Physical Anesthesia Plan  ASA: III  Anesthesia Plan: General   Post-op Pain Management:    Induction: Intravenous  Airway Management Planned: Oral ETT  Additional Equipment:   Intra-op Plan:   Post-operative Plan: Extubation in OR  Informed Consent: I have reviewed the patients History and Physical, chart, labs and discussed the procedure including the risks, benefits and alternatives for the proposed anesthesia with the patient or authorized representative who has indicated his/her understanding and acceptance.   Dental Advisory Given  Plan Discussed with: CRNA and Surgeon  Anesthesia Plan Comments:         Anesthesia Quick Evaluation

## 2011-02-20 NOTE — Anesthesia Postprocedure Evaluation (Signed)
  Anesthesia Post-op Note  Patient: Jasmin Beck  Procedure(s) Performed:  CANNULATED HIP PINNING - percutaneous cannulated screw fixation right hip  Patient Location: PACU  Anesthesia Type: General  Level of Consciousness: awake and alert   Airway and Oxygen Therapy: Patient Spontanous Breathing  Post-op Pain: mild  Post-op Assessment: Post-op Vital signs reviewed, Patient's Cardiovascular Status Stable, Respiratory Function Stable, Patent Airway and No signs of Nausea or vomiting  Post-op Vital Signs: stable  Complications: No apparent anesthesia complications

## 2011-02-20 NOTE — Interval H&P Note (Signed)
History and Physical Interval Note:  02/20/2011 12:15 PM  Jasmin Beck  has presented today for surgery, with the diagnosis of right femoral neck fracture  The various methods of treatment have been discussed with the patient and family. After consideration of risks, benefits and other options for treatment, the patient has consented to  Procedure(s): RIGHT HIP CANNULATED HIP PINNING as a surgical intervention .  The patients' history has been reviewed, patient examined, no change in status, stable for surgery.  I have reviewed the patients' chart and labs.  Questions were answered to the patient's satisfaction.     [name]

## 2011-02-21 ENCOUNTER — Encounter (HOSPITAL_COMMUNITY): Payer: Self-pay | Admitting: Orthopedic Surgery

## 2011-02-21 LAB — CBC
Platelets: 190 10*3/uL (ref 150–400)
RBC: 2.92 MIL/uL — ABNORMAL LOW (ref 3.87–5.11)
WBC: 8.2 10*3/uL (ref 4.0–10.5)

## 2011-02-21 LAB — BASIC METABOLIC PANEL
CO2: 23 mEq/L (ref 19–32)
Calcium: 7.7 mg/dL — ABNORMAL LOW (ref 8.4–10.5)
Chloride: 104 mEq/L (ref 96–112)
Sodium: 134 mEq/L — ABNORMAL LOW (ref 135–145)

## 2011-02-21 MED ORDER — DIAZEPAM 5 MG PO TABS
5.0000 mg | ORAL_TABLET | Freq: Three times a day (TID) | ORAL | Status: DC | PRN
  Filled 2011-02-21: qty 1

## 2011-02-21 NOTE — Progress Notes (Signed)
Physical Therapy Evaluation Patient Details Name: Jasmin Beck MRN: [id number] DOB: [birth year] Today's Date: 02/21/2011 1030-1100 ev2 Problem List:  Patient Active Problem List  Diagnoses  . Hip fracture, right    Past Medical History:  Past Medical History  Diagnosis Date  . DEMENTIA   . Hypothyroidism   . Hypertension    Past Surgical History: No past surgical history on file.  PT Assessment/Plan/Recommendation PT Assessment Clinical Impression Statement: pt admitted after a fall with Rhip fx, s/p hip pinning, now will benefit from PT to improve safety and functional mobility to DC to SNF PT Recommendation/Assessment: Patient will need skilled PT in the acute care venue PT Problem List: Decreased strength;Decreased range of motion;Decreased activity tolerance;Decreased mobility;Decreased cognition;Decreased safety awareness;Decreased knowledge of precautions PT Therapy Diagnosis : Difficulty walking;Acute pain;Altered mental status PT Plan PT Frequency: Min 3X/week PT Treatment/Interventions: DME instruction;Gait training;Functional mobility training;Patient/family education PT Recommendation Recommendations for Other Services: OT consult Follow Up Recommendations: Skilled nursing facility Equipment Recommended: Defer to next venue PT Goals  Acute Rehab PT Goals PT Goal Formulation: With patient/family Time For Goal Achievement: 2 weeks Pt will go Sit to Supine/Side: with supervision PT Goal: Sit to Supine/Side - Progress: Not met Pt will go Stand to Sit: with supervision PT Goal: Stand to Sit - Progress: Not met Pt will Ambulate: >150 feet;with supervision;with rolling walker PT Goal: Ambulate - Progress: Not met Pt will Perform Home Exercise Program: with supervision, verbal cues required/provided PT Goal: Perform Home Exercise Program - Progress: Not met  PT Evaluation Precautions/Restrictions  Precautions Precautions: Fall Precaution Comments: pt has dementia  and does nt remember to call for assistance Required Braces or Orthoses: No Restrictions Weight Bearing Restrictions: Yes RUE Weight Bearing: Weight bearing as tolerated Prior Functioning  Home Living Lives With: Family Receives Help From: Family Type of Home: House Home Adaptive Equipment: Walker - rolling Prior Function Level of Independence: Requires assistive device for independence Cognition Cognition Arousal/Alertness: Awake/alert Overall Cognitive Status: History of cognitive impairments History of Cognitive Impairment: Appears at baseline functioning Orientation Level: Oriented to person;Oriented to situation Sensation/Coordination Sensation Light Touch: Appears Intact Coordination Gross Motor Movements are Fluid and Coordinated: Yes Extremity Assessment RUE Assessment RUE Assessment: Within Functional Limits LUE Assessment LUE Assessment: Within Functional Limits RLE Assessment RLE Assessment: Exceptions to [facility] RLE Strength RLE Overall Strength Comments: pt was able to bear weight and advance le LLE Assessment LLE Assessment: Within Functional Limits Mobility (including Balance) Bed Mobility Bed Mobility: Yes Supine to Sit: 4: Min assist;HOB flat Supine to Sit Details (indicate cue type and reason): pt able to move RLE to edge, appears to have some pain there Transfers Transfers: Yes Sit to Stand: 4: Min assist;From bed;From chair/3-in-1;With armrests Sit to Stand Details (indicate cue type and reason): pt able to stand, weigth on RLE, can stand with min guard for ADL Stand to Sit: 4: Min assist;With armrests;To chair/3-in-1 Stand to Sit Details: pt able to gently sit to [facility] and to recliner Ambulation/Gait Ambulation/Gait: Yes Ambulation/Gait Assistance: 4: Min assist Ambulation/Gait Assistance Details (indicate cue type and reason): pt able to use TW to decrease weight on RLE, advances RLE without difficulty Ambulation Distance (Feet): 15 Feet (x  2) Assistive device: Rolling walker Gait Pattern: Step-to pattern  Posture/Postural Control Posture/Postural Control: No significant limitations Exercise    End of Session PT - End of Session Activity Tolerance: Patient tolerated treatment well Patient left: in chair;with call bell in reach;with family/visitor present (provided written instruction to remind  pt to not get up unle) Nurse Communication: Mobility status for transfers;Mobility status for ambulation General Behavior During Session: [facility] for tasks performed Cognition: [facility] for tasks performed  Rada Hay 02/21/2011, 11:45 AM

## 2011-02-21 NOTE — Progress Notes (Signed)
Patient became confused, stating that she was going home today and need to get into her pajamas. Patient was re-oriented to where she was and that she had surgery. She was then repositioned and patient began to complain of pain. 0.5mg  of Diludad given, however patient continued to try and get out of the bed and complained of how much she was hurting. On call MD- Dr. Victorino Dike was paged and an order for Valium 5 mg was obtained q8h prn. Will continue to monitor patient.

## 2011-02-21 NOTE — Progress Notes (Signed)
Subjective: 1 Day Post-Op Procedure(s) (LRB): CANNULATED HIP PINNING (Right)   Patient reports pain as none. No events. Confusion noted when talking with pt.  Objective:   VITALS:   Filed Vitals:   02/21/11 0535  BP: 124/67  Pulse: 70  Temp: 98 F (36.7 C)  Resp: 16    Neurovascular intact Dorsiflexion/Plantar flexion intact Incision: dressing C/D/I No cellulitis present Compartment soft  LABS  Basename 02/21/11 0426 02/19/11 1035  HGB 9.1* 13.5  HCT 26.2* 37.9  WBC 8.2 8.5  PLT 190 212     Basename 02/21/11 0426 02/19/11 1035  NA 134* 133*  K 4.1 3.6  BUN 16 21  CREATININE 0.68 0.84  GLUCOSE 124* 163*     Assessment/Plan: 1 Day Post-Op Procedure(s) (LRB): CANNULATED HIP PINNING (Right)  D/C foley  Advance diet Up with therapy Discharge to SNF tomorrow if available   Anastasio Auerbach. Tarin Navarez   PAC  02/21/2011, 7:23 AM

## 2011-02-21 NOTE — Progress Notes (Signed)
FL2 in shadow chart for MD signature. Pt has been faxed out to [facility]. Will provide bed offers as received.

## 2011-02-21 NOTE — Op Note (Signed)
Jasmin Beck, Jasmin Beck NO.:  [id number]  MEDICAL RECORD NO.:  [id number]  LOCATION:  1614                         FACILITY:  [facility]  PHYSICIAN:  Madlyn Frankel. Charlann Boxer, M.D.  DATE OF BIRTH:  [birth year]  DATE OF PROCEDURE: DATE OF DISCHARGE:                              OPERATIVE REPORT   PREOPERATIVE DIAGNOSIS:  Nondisplaced valgus impacted right femoral neck fracture.  POSTOPERATIVE DIAGNOSIS:  Nondisplaced valgus impacted right femoral neck fracture.  PROCEDURE:  In situ percutaneous cannula screw fixation of the right hip utilizing four 7.3 mm x 16 mm cannulated screws.  SURGEON:  Madlyn Frankel. Charlann Boxer, M.D.  ASSISTANT:  Surgical team.  ANESTHESIA:  General.  SPECIMENS:  None.  COMPLICATIONS:  None.  DRAINS:  None.  ESTIMATED BLOOD LOSS:  Less than 50 cc.  INDICATIONS FOR PROCEDURE:  Jasmin Beck is an 78 year old female with some dementia with a history of a left femoral neck fracture, requiring hemiarthroplasty.  She had presented to the emergency room after a week- old history of a fall with some right hip pain.  Radiographs revealed a valgus-impacted fracture.  She had been walking on it for a 2-3 days prior to evaluation.  She was transferred to [facility] from an outside facility for management as a previous treatments here.  She was seen and evaluated and admitted to the hospital.  I reviewed surgical options, and based on the fact she had been ambulating on this hip fracture with the pattern present, it is felt to be a stable pattern and it would warrant a screw fixation and stabilization to prevent further need for surgery.  Risks of potential failure of this methadone needing further surgery, conversion to a total hip replacement, discussed reviewed in addition to standard risks of infection DVT.  We discussed the postoperative course and expectations.  PROCEDURE IN DETAIL:  The patient was brought to the operative theater. Once adequate  anesthesia preop antibiotics, clindamycin administered. She was positioned supine on the fracture table.  Her left leg was flexed and abducted away with the lateral aspect of her legs padded to protect the peroneal nerve.  The right foot was placed in the traction boot.  No traction was applied.  Once positioned adequately, the fluoroscopy was used to confirm maintenance of the fracture position.  Once I was satisfied this with the case, the right hip from the iliac crest to the knee area was prepped and draped in sterile fashion using a shower curtain technique.  Fluoroscopy was then brought back to the field.  A time-out was performed identifying the patient, planned procedure, and extremity. Landmarks were identified in terms of orientation of the potential screw placement.  Then, a lateral incision was made at the level of the lesser trochanter.  Sharp dissection was carried through the iliotibial band fascia.  Guidewire was then inserted in the center of the femoral head in the inferior aspect of the femoral neck.  Once I had confirmed this orientation, 3 other drills were placed to provide correct pattern of screws to provide stability of this femoral neck fracture.  I then measured the depth for the screws and placed a 105 mm screw in the inferior  aspect, an 85 in the posterior, a 2nd 80 mm screw centrally and posteriorly, and then anteriorly a 75 mm screw.  The screws were tightened down the lateral cortex.  Pins were removed and final radiographs were obtained.  Following this, the wound was irrigated with normal saline solution.  The iliotibial band was reapproximated using #1 Vicryl. The remaining wound was closed with 2-0 Vicryl, running 4-0 Monocryl.  The hip was cleaned, dried, and dressed sterilely using Dermabond and Aquacel dressing.  The patient was brought to recovery room, extubated in stable condition, tolerating the procedure well.     Madlyn Frankel Charlann Boxer,  M.D.     MDO/MEDQ  D:  02/20/2011  T:  02/21/2011  Job:  [id number]

## 2011-02-21 NOTE — Progress Notes (Signed)
Occupational Therapy Evaluation Patient Details Name: Jasmin Beck MRN: [id number] DOB: 1919-11-28 Today's Date: 02/21/2011 EV2 [id number] Problem List:  Patient Active Problem List  Diagnoses  . Hip fracture, right    Past Medical History:  Past Medical History  Diagnosis Date  . DEMENTIA   . Hypothyroidism   . Hypertension    Past Surgical History:  Past Surgical History  Procedure Date  . Hip pinning,cannulated 02/20/2011    Procedure: CANNULATED HIP PINNING;  Surgeon: [name];  Location: [facility];  Service: Orthopedics;  Laterality: Right;  percutaneous cannulated screw fixation right hip    OT Assessment/Plan/Recommendation OT Assessment Clinical Impression Statement: Pt presents with a new R hip fx and decline from baseline level of functioning. Skilled OT recommended to maximize I w/BADLs to supervision level in prep for d/c to next venue of care. OT Recommendation/Assessment: Patient will need skilled OT in the acute care venue OT Problem List: Decreased activity tolerance;Decreased cognition;Decreased knowledge of use of DME or AE Barriers to Discharge: Inaccessible home environment;Decreased caregiver support OT Therapy Diagnosis : Generalized weakness OT Plan OT Frequency: Min 1X/week OT Treatment/Interventions: Self-care/ADL training;DME and/or AE instruction;Therapeutic activities;Patient/family education OT Recommendation Follow Up Recommendations: Skilled nursing facility Equipment Recommended: Defer to next venue Individuals Consulted Consulted and Agree with Results and Recommendations: Patient OT Goals Acute Rehab OT Goals OT Goal Formulation: With patient ADL Goals Pt Will Perform Grooming: with supervision;Standing at sink (x 3 tasks to improve standing activity tolerance.) ADL Goal: Grooming - Progress: Not met Pt Will Transfer to Toilet: with supervision;Ambulation;3-in-1 ADL Goal: Toilet Transfer - Progress: Progressing toward goals Pt Will  Perform Toileting - Clothing Manipulation: with supervision;Standing ADL Goal: Toileting - Clothing Manipulation - Progress: Progressing toward goals Pt Will Perform Toileting - Hygiene: with supervision;Sit to stand from 3-in-1/toilet ADL Goal: Toileting - Hygiene - Progress: Progressing toward goals  OT Evaluation Precautions/Restrictions  Precautions Precautions: Fall Precaution Comments: Pt has dementia and does not remember to call for assistance. PT placed sign in room to remind pt. Required Braces or Orthoses: No Restrictions Weight Bearing Restrictions: No RUE Weight Bearing: Weight bearing as tolerated Prior Functioning Home Living Lives With: Family Receives Help From: Family Type of Home: House Home Adaptive Equipment: Walker - rolling Additional Comments: Additional home layout questions not asked. Plan is for pt [facility]  Prior Function Level of Independence: Needs assistance with ADLs;Requires assistive device for independence ADL ADL Eating/Feeding: Simulated;Independent Where Assessed - Eating/Feeding: Chair Grooming: Simulated;Set up Where Assessed - Grooming: Sitting, chair;Unsupported Upper Body Bathing: Simulated;Set up Where Assessed - Upper Body Bathing: Sitting, chair;Unsupported Lower Body Bathing: Simulated;Moderate assistance Where Assessed - Lower Body Bathing: Sit to stand from chair Upper Body Dressing: Simulated;Set up Where Assessed - Upper Body Dressing: Sitting, chair;Unsupported Lower Body Dressing: Simulated;Moderate assistance Where Assessed - Lower Body Dressing: Sit to stand from chair Toilet Transfer: Performed;Minimal assistance Toilet Transfer Method: Stand pivot Toilet Transfer Equipment: Bedside commode Toileting - Clothing Manipulation: Performed;Minimal assistance Where Assessed - Toileting Clothing Manipulation: Sit to stand from 3-in-1 or toilet Toileting - Hygiene: Performed;Minimal assistance Where Assessed -  Toileting Hygiene: Sit to stand from 3-in-1 or toilet Tub/Shower Transfer: Not assessed Tub/Shower Transfer Method: Not assessed Equipment Used: Rolling walker (3:1) ADL Comments: Pt states she fatigues quickly. Vision/Perception  Vision - History Baseline Vision: Wears glasses all the time Visual History: Cataracts Patient Visual Report: No change from baseline Vision - Assessment Vision Assessment: Vision not tested Cognition Cognition Arousal/Alertness: Awake/alert  Overall Cognitive Status: History of cognitive impairments History of Cognitive Impairment: Appears at baseline functioning Orientation Level: Oriented to person;Oriented to time;Oriented to situation;Disoriented to place Cognition - Other Comments: Pt with STM and word finding impairments. Told therapist the same story 3 times in 20 min. Sensation/Coordination Sensation Light Touch: Appears Intact Coordination Gross Motor Movements are Fluid and Coordinated: Yes Extremity Assessment RUE Assessment RUE Assessment: Within Functional Limits LUE Assessment LUE Assessment: Within Functional Limits Mobility  Bed Mobility Bed Mobility: No Supine to Sit: 4: Min assist;HOB flat Supine to Sit Details (indicate cue type and reason): pt able to move RLE to edge, appears to have some pain there Transfers Transfers: Yes Sit to Stand: 4: Min assist;From chair/3-in-1;With upper extremity assist Sit to Stand Details (indicate cue type and reason): pt able to stand, weigth on RLE, can stand with min guard for ADL Stand to Sit: To chair/3-in-1;4: Min assist Stand to Sit Details: pt able to gently sit to [facility] and to recliner Exercises   End of Session OT - End of Session Equipment Utilized During Treatment: Other (comment) (RW, 3:1) Activity Tolerance: Patient tolerated treatment well Patient left: in chair;with call bell in reach Nurse Communication: Mobility status for transfers General Behavior During Session: [facility] for  tasks performed Cognition: Impaired, at baseline   Kenny Rea A [phone] 02/21/2011, 2:22 PM

## 2011-02-22 LAB — CBC
MCV: 89.3 fL (ref 78.0–100.0)
Platelets: 178 10*3/uL (ref 150–400)
RBC: 2.81 MIL/uL — ABNORMAL LOW (ref 3.87–5.11)
WBC: 5.8 10*3/uL (ref 4.0–10.5)

## 2011-02-22 LAB — BASIC METABOLIC PANEL
CO2: 22 mEq/L (ref 19–32)
Chloride: 104 mEq/L (ref 96–112)
Creatinine, Ser: 0.65 mg/dL (ref 0.50–1.10)

## 2011-02-22 MED ORDER — POLYETHYLENE GLYCOL 3350 17 G PO PACK: 17.0000 g | PACK | Freq: Every day | ORAL | Status: AC | PRN

## 2011-02-22 MED ORDER — DSS 100 MG PO CAPS: 100.0000 mg | ORAL_CAPSULE | Freq: Two times a day (BID) | ORAL | Status: AC

## 2011-02-22 MED ORDER — METHOCARBAMOL 500 MG PO TABS: 500.0000 mg | ORAL_TABLET | Freq: Four times a day (QID) | ORAL | Status: AC | PRN

## 2011-02-22 MED ORDER — ACETAMINOPHEN 325 MG PO TABS: 325.0000 mg | ORAL_TABLET | Freq: Four times a day (QID) | ORAL | Status: AC | PRN

## 2011-02-22 NOTE — Discharge Summary (Signed)
Physician Discharge Summary  Patient ID: Jasmin Beck MRN: [id number] DOB/AGE: [birth year] 76 y.o.  Admit date: 02/19/2011 Discharge date: 02/22/2011  Procedures:  Procedure(s) (LRB): CANNULATED HIP PINNING (Right)  Attending Physician: [name]   Admission Diagnoses: Right hip fracture / pain  Discharge Diagnoses:  Active Problems:  Hip fracture, right Hypothyroid  Hyperlipidemia  Arthritis  Neuropathy  Hx of breast CA  HPI: Pt is a 78 y.o. female complaining of right hip pain. Pt was at home walking circles when she fell and after having lunch. She had instant right hip pain and vomited. She was brought to the ER in [facility], subsequently was transferred to [facility]. Doctor Charlann Boxer was consulted. X-rays reveal right hip fracture. Various options are discussed with the patient and family. Risks, benefits and expectations were discussed with the patient. Patient understand the risks, benefits and expectations and wishes to proceed with ORIF of the right hip.   PCP: No primary provider on file.   Discharged Condition: good  Hospital Course:  Patient underwent the above stated procedure on 02/20/2011. Patient tolerated the procedure well and brought to the recovery room in good condition and subsequently to the floor.  POD #1 BP: 124/67 ; Pulse: 70 ; Temp: 98 F (36.7 C) ; Resp: 16  Pt's foley was removed, as well as the hemovac drain removed. IV was changed to a saline lock. Patient reports pain as none. No events. Confusion noted when talking with pt. Neurovascular intact, dorsiflexion/plantar flexion intact, incision: dressing C/D/I, no cellulitis present and compartment soft.  LABS  Basename  02/21/11 0426    HGB  9.1*    HCT  26.2*     POD #2  BP: 135/55 ; Pulse: 76 ; Temp: 98.6 F (37 C) ; Resp: 16  Patient reports pain as mild. No events. Confusion noted when talking with pt. Ready for discharge. Neurovascular intact, dorsiflexion/plantar flexion  intact, incision: dressing C/D/I, no cellulitis present and compartment soft.  LABS  Basename  02/22/11 0432     HGB  8.8*     HCT  25.1*       Discharge Exam: Extremities: Homans sign is negative, no sign of DVT, no edema, redness or tenderness in the calves or thighs and no ulcers, gangrene or trophic changes  Disposition: SNF with follow up in 2 weeks with Dr. Charlann Boxer at [facility]  Discharge Orders    Future Appointments: Provider: Department: Dept Phone: Center:   07/08/2011 8:00 AM Billie Lade, MD [facility] [phone] None     Future Orders Please Complete By Expires   Diet - low sodium heart healthy      Call MD / Call 911      Comments:   If you experience chest pain or shortness of breath, CALL 911 and be transported to the hospital emergency room.  If you develope a fever above 101 F, pus (white drainage) or increased drainage or redness at the wound, or calf pain, call your surgeon's office.   Discharge instructions      Comments:   Maintain surgical dressing for 8 days, then replace with gauze and tape. Keep the area dry and clean until follow up. Follow up in 2 weeks at [facility]. Call with any questions or concerns.     Constipation Prevention      Comments:   Drink plenty of fluids.  Prune juice may be helpful.  You may use a stool softener, such as Colace (  over the counter) 100 mg twice a day.  Use MiraLax (over the counter) for constipation as needed.   Increase activity slowly as tolerated      Weight Bearing as taught in Physical Therapy      Comments:   Use a walker or crutches as instructed.   Driving restrictions      Comments:   No driving for 4 weeks   Change dressing      Comments:   Maintain surgical dressing for 8 days, then replace with 4x4 guaze and tape. Keep the area dry and clean.   TED hose      Comments:   Use stockings (TED hose) for 2 weeks on both leg(s).  You may remove them at night for sleeping.        Current Discharge Medication List    START taking these medications   Details  acetaminophen (TYLENOL) 325 MG tablet Take 1-2 tablets (325-650 mg total) by mouth every 6 (six) hours as needed.    docusate sodium 100 MG CAPS Take 100 mg by mouth 2 (two) times daily.    methocarbamol (ROBAXIN) 500 MG tablet Take 1 tablet (500 mg total) by mouth every 6 (six) hours as needed (muscle spasms). Qty: 30 tablet, Refills: 0    polyethylene glycol (MIRALAX / GLYCOLAX) packet Take 17 g by mouth daily as needed.      CONTINUE these medications which have NOT CHANGED   Details  amLODipine (NORVASC) 10 MG tablet Take 10 mg by mouth daily.      aspirin EC 81 MG tablet Take 81 mg by mouth daily.      atorvastatin (LIPITOR) 20 MG tablet Take 20 mg by mouth daily.      clopidogrel (PLAVIX) 75 MG tablet Take 75 mg by mouth daily.      ferrous fumarate (HEMOCYTE - 106 MG FE) 325 (106 FE) MG TABS Take 1 tablet by mouth.      lansoprazole (PREVACID) 30 MG capsule Take 30 mg by mouth daily.      levothyroxine (SYNTHROID, LEVOTHROID) 75 MCG tablet Take 75 mcg by mouth daily.      loratadine (CLARITIN) 10 MG tablet Take 10 mg by mouth daily.      solifenacin (VESICARE) 5 MG tablet Take 5 mg by mouth at bedtime.      sulindac (CLINORIL) 200 MG tablet Take 200 mg by mouth daily.      terbinafine (LAMISIL) 250 MG tablet Take 250 mg by mouth at bedtime.      valsartan (DIOVAN) 320 MG tablet Take 320 mg by mouth daily.      vitamin E 400 UNIT capsule Take 800 Units by mouth daily.         Signed: Anastasio Auerbach. Efrata Brunner   PAC  02/22/2011, 8:40 AM

## 2011-02-22 NOTE — Progress Notes (Signed)
Subjective: 2 Days Post-Op Procedure(s) (LRB): CANNULATED HIP PINNING (Right)   Patient reports pain as mild. No events. Confusion noted when talking with pt. Ready for discharge.  Objective:   VITALS:   Filed Vitals:   02/22/11 0530  BP: 135/55  Pulse: 76  Temp: 98.6 F (37 C)  Resp: 16    Neurovascular intact Dorsiflexion/Plantar flexion intact Incision: dressing C/D/I No cellulitis present Compartment soft  LABS  Basename 02/22/11 0432 02/21/11 0426 02/19/11 1035  HGB 8.8* 9.1* 13.5  HCT 25.1* 26.2* 37.9  WBC 5.8 8.2 8.5  PLT 178 190 212     Basename 02/22/11 0432 02/21/11 0426 02/19/11 1035  NA 133* 134* 133*  K 3.8 4.1 3.6  BUN 14 16 21   CREATININE 0.65 0.68 0.84  GLUCOSE 106* 124* 163*     Assessment/Plan: 2 Days Post-Op Procedure(s) (LRB): CANNULATED HIP PINNING (Right)   Up with therapy Discharge to SNF today if possible   Anastasio Auerbach. Rashanda Magloire   PAC  02/22/2011, 8:26 AM

## 2011-02-22 NOTE — Progress Notes (Signed)
Patient being discharged to [facility]. Iv site removed. Family in with patient to assist with transfer. Patient is oriented to self but still confused as to situation. Is pleasant. Is non compliant with ambulation assist. Left unit via ems with family.

## 2011-02-22 NOTE — Progress Notes (Signed)
Patient is cleared for discharge. CSW spoke with Waynetta Sandy at [facility], they can offer patient a bed. CSW spoke with patients granddaughter, they are in agreement with facility and will go there to complete paperwork. Transportation set up with [facility]. No other CSW needs noted. Saddie Sandeen C. Marishka Rentfrow MSW, LCSW [phone]

## 2011-02-22 NOTE — Progress Notes (Signed)
Physical Therapy Treatment Patient Details Name: Jasmin Beck MRN: [id number] DOB: 1919/04/12 Today's Date: 02/22/2011  R ORIF 2nd fall/fx  10:10 - 10:40 1 gt  1 ta  PT Assessment/Plan  PT - Assessment/Plan Comments on Treatment Session: pt plans to D/C to snf for ST Rehab PT Plan: Discharge plan remains appropriate Follow Up Recommendations: Skilled nursing facility Equipment Recommended: Defer to next venue PT Goals  Acute Rehab PT Goals PT Goal Formulation: With patient Pt will go Sit to Supine/Side: with supervision PT Goal: Sit to Supine/Side - Progress: Progressing toward goal Pt will go Stand to Sit: with supervision PT Goal: Stand to Sit - Progress: Progressing toward goal Pt will Ambulate: >150 feet;with supervision;with rolling walker PT Goal: Ambulate - Progress: Progressing toward goal Pt will Perform Home Exercise Program: with supervision, verbal cues required/provided PT Goal: Perform Home Exercise Program - Progress: Progressing toward goal  PT Treatment Precautions/Restrictions  Precautions Precautions: Fall Precaution Comments: Pt has dementia and does not remember to call for assistance. PT placed sign in room to remind pt. Required Braces or Orthoses: No Restrictions Weight Bearing Restrictions: No RUE Weight Bearing: Weight bearing as tolerated Mobility (including Balance) Bed Mobility Bed Mobility: Yes Supine to Sit:  (MinGuard assist) Supine to Sit Details (indicate cue type and reason): increased time and HOB elevated 35' Transfers Transfers: Yes Sit to Stand: 4: Min assist;From bed Sit to Stand Details (indicate cue type and reason): increased time 2nd R hip pain and bacvk pain(chronic) Stand to Sit: 4: Min assist;To chair/3-in-1 Stand to Sit Details: good safety technique Ambulation/Gait Ambulation/Gait: Yes Ambulation/Gait Assistance: 4: Min assist Ambulation/Gait Assistance Details (indicate cue type and reason): 85 feet x 2 with a  sitting rest break Ambulation Distance (Feet): 85 Feet Assistive device: Rolling walker Gait Pattern: Step-through pattern;Trunk flexed Stairs: No Wheelchair Mobility Wheelchair Mobility: No    Exercise    End of Session PT - End of Session Equipment Utilized During Treatment: Gait belt Activity Tolerance: Patient tolerated treatment well Patient left: in bed;with call bell in reach General Behavior During Session: [facility] for tasks performed Cognition: [facility] for tasks performed  Felecia Shelling PTA [facility]  Acute  Rehab Pager     [phone]

## 2011-02-28 ENCOUNTER — Ambulatory Visit: Payer: Medicare Other | Admitting: Gynecologic Oncology

## 2011-05-30 ENCOUNTER — Ambulatory Visit: Payer: Medicare Other | Admitting: Gynecologic Oncology

## 2011-06-04 ENCOUNTER — Ambulatory Visit: Payer: Medicare Other | Admitting: Gynecologic Oncology

## 2011-06-24 ENCOUNTER — Ambulatory Visit: Admission: RE | Admit: 2011-06-24 | Payer: Medicare Other | Source: Ambulatory Visit | Admitting: Radiation Oncology

## 2011-06-26 ENCOUNTER — Other Ambulatory Visit (HOSPITAL_COMMUNITY)
Admission: RE | Admit: 2011-06-26 | Discharge: 2011-06-26 | Disposition: A | Discharge status: Home / Self Care | Payer: Medicare Other | Source: Ambulatory Visit | Attending: Gynecologic Oncology | Admitting: Gynecologic Oncology

## 2011-06-26 ENCOUNTER — Ambulatory Visit: Payer: Medicare Other | Attending: Gynecologic Oncology | Admitting: Gynecologic Oncology

## 2011-06-26 ENCOUNTER — Encounter: Payer: Self-pay | Admitting: Gynecologic Oncology

## 2011-06-26 VITALS — BP 138/66 | HR 64 | Temp 98.1°F | Ht 64.0 in | Wt 145.0 lb

## 2011-06-26 DIAGNOSIS — I10 Essential (primary) hypertension: Secondary | ICD-10-CM | POA: Insufficient documentation

## 2011-06-26 DIAGNOSIS — E785 Hyperlipidemia, unspecified: Secondary | ICD-10-CM | POA: Insufficient documentation

## 2011-06-26 DIAGNOSIS — E039 Hypothyroidism, unspecified: Secondary | ICD-10-CM | POA: Insufficient documentation

## 2011-06-26 DIAGNOSIS — C549 Malignant neoplasm of corpus uteri, unspecified: Secondary | ICD-10-CM | POA: Insufficient documentation

## 2011-06-26 DIAGNOSIS — C541 Malignant neoplasm of endometrium: Secondary | ICD-10-CM

## 2011-06-26 DIAGNOSIS — Z124 Encounter for screening for malignant neoplasm of cervix: Secondary | ICD-10-CM | POA: Insufficient documentation

## 2011-06-26 DIAGNOSIS — Z79899 Other long term (current) drug therapy: Secondary | ICD-10-CM | POA: Insufficient documentation

## 2011-06-26 DIAGNOSIS — Z853 Personal history of malignant neoplasm of breast: Secondary | ICD-10-CM | POA: Insufficient documentation

## 2011-06-26 NOTE — Progress Notes (Signed)
Consult Note: Gyn-Onc  Jasmin Beck 78 y.o. female  CC:  Chief Complaint  Patient presents with  . Endo ca    Follow up    HPI: Patient is a 74 soon to be 78 year old with history of stage II endometrial carcinoma. She initially presented with uterine bleeding in February of 2010 and evaluation with endometrial biopsy showed grade 1 endometrial carcinoma. In May 2002 and a total laparoscopic hysterectomy bilateral salpingo-oophorectomy bilateral pelvic lymph node dissection. Final pathology was notable for involvement of the cervix with a grade 1 endometrioid carcinoma. She received adjuvant radiation therapy with high-dose rate brachii therapy but she completed in September 2010 and has had no evidence of recurrent disease since that time. We last saw her in April of 2012 at which time her exam and Pap smear were unremarkable. She missed her appointment with Dr. Roselind Messier due to other medical issues.  Interval History:  She fell and broke her right hip in January 2013 had a pinning. She's been in a nursing home since that time. She needs to use a walker to walk.  Review of Systems: She complains of right hip pain and difficulty walking. She has a vaginal bleeding. She has a significant change about bladder habits patient is a nausea, vomiting, fevers, chills, unintentional weight loss or weight gain. Her quality of life is diminished secondary to her hip pain and mobility issues. She denies any unintentional weight loss weight gain as stated above he is eating quite well. She denies any chest pain or shortness of breath other than getting somewhat deconditioned per her report. She does participate in activities at her nursing home.  10 point review of systems is otherwise negative.  Current Meds:  Outpatient Encounter Prescriptions as of 06/26/2011  Medication Sig Dispense Refill  . albuterol (ACCUNEB) 0.63 MG/3ML nebulizer solution Take 1 ampule by nebulization every 6 (six) hours as needed.       Marland Kitchen amLODipine (NORVASC) 10 MG tablet Take 10 mg by mouth daily.        Marland Kitchen aspirin EC 81 MG tablet Take 81 mg by mouth daily.        . clopidogrel (PLAVIX) 75 MG tablet Take 75 mg by mouth daily.        . cyanocobalamin (,VITAMIN B-12,) 1000 MCG/ML injection Inject 1,000 mcg into the muscle every 30 (thirty) days.      Marland Kitchen dextromethorphan-guaiFENesin (MUCINEX DM) 30-600 MG per 12 hr tablet Take 1 tablet by mouth as needed. Every 12 hours      . ferrous fumarate (HEMOCYTE - 106 MG FE) 325 (106 FE) MG TABS Take 1 tablet by mouth.        . lansoprazole (PREVACID) 30 MG capsule Take 30 mg by mouth daily.        Marland Kitchen levothyroxine (SYNTHROID, LEVOTHROID) 75 MCG tablet Take 75 mcg by mouth daily.        Marland Kitchen loratadine (CLARITIN) 10 MG tablet Take 10 mg by mouth daily.        . methocarbamol (ROBAXIN) 500 MG tablet Take 500 mg by mouth as needed.      . ondansetron (ZOFRAN) 4 MG tablet Take 4 mg by mouth every 8 (eight) hours as needed.      . polyethylene glycol powder (GLYCOLAX/MIRALAX) powder Take 17 g by mouth as needed.      . pravastatin (PRAVACHOL) 80 MG tablet Take 80 mg by mouth daily.      . sertraline (ZOLOFT) 50 MG tablet  Take 50 mg by mouth daily.      . solifenacin (VESICARE) 5 MG tablet Take 5 mg by mouth at bedtime.        . sulindac (CLINORIL) 200 MG tablet Take 200 mg by mouth daily.        . traMADol (ULTRAM) 50 MG tablet Take 50 mg by mouth every 8 (eight) hours as needed.      . valsartan (DIOVAN) 320 MG tablet Take 320 mg by mouth daily.        . vitamin E 400 UNIT capsule Take 800 Units by mouth daily.        Marland Kitchen atorvastatin (LIPITOR) 20 MG tablet Take 20 mg by mouth daily.        Marland Kitchen terbinafine (LAMISIL) 250 MG tablet Take 250 mg by mouth at bedtime.          Allergy:  Allergies  Allergen Reactions  . Erythromycin Nausea And Vomiting  . Hydrocodone Other (See Comments)    'out of her head'  . Penicillins Rash  . Sterapred (Prednisone) Other (See Comments)    unknown     Social Hx:   History   Social History  . Marital Status: Widowed    Spouse Name: N/A    Number of Children: N/A  . Years of Education: N/A   Occupational History  . Not on file.   Social History Main Topics  . Smoking status: Never Smoker   . Smokeless tobacco: Not on file  . Alcohol Use: Not on file  . Drug Use: Not on file  . Sexually Active: No   Other Topics Concern  . Not on file   Social History Narrative  . No narrative on file    Past Surgical Hx:  Past Surgical History  Procedure Date  . Hip pinning,cannulated 02/20/2011    Procedure: CANNULATED HIP PINNING;  Surgeon: [name];  Location: [facility];  Service: Orthopedics;  Laterality: Right;  percutaneous cannulated screw fixation right hip  . Mastectomy 1990    early 1990s, left breast  . Rotator cuff repair   . Total hip arthroplasty     Left  . Abdominal hysterectomy     Past Medical Hx:  Past Medical History  Diagnosis Date  . DEMENTIA   . Hypothyroidism   . Hypertension   . Breast cancer   . Hypothyroidism   . Hyperlipidemia, mixed     Family Hx: History reviewed. No pertinent family history.  Vitals:  Blood pressure 138/66, pulse 64, temperature 98.1 F (36.7 C), temperature source Oral, height 5\' 4"  (1.626 m), weight 145 lb (65.772 kg).  Physical Exam: Well-nourished well-developed female in no acute distress.  Neck: Supple, no lymphadenopathy no thyromegaly.  Lungs: Clear to auscultation bilaterally.  Cardiovascular: Regular rate and rhythm.  Abdomen: Soft, nontender, nondistended. There is no evidence of an incisional hernias. There is no rebound or guarding.  Groins: No lymphadenopathy.  Extremities: Right 1+ nonpitting edema. Left no edema.  Pelvic: External genitalia notable for age markedly atrophic. The vagina is markedly atrophic. Vaginal cuff is visualized. There is no visible lesions. ThinPrep Pap was submitted without difficulty. Bimanual examination was no masses  or nodularity. Rectal confirms.  Assessment/Plan: 78 year old with stage II endometrial carcinoma diagnosed and treated in 2010 with no evidence of recurrent disease.  Plan: We'll followup in results of your Pap smear from today. Return to see [place] in one year and see Dr. Sharlett Iles in 6 months.  Marquese Burkland A.,  MD 06/26/2011, 2:36 PM

## 2011-06-26 NOTE — Patient Instructions (Signed)
F/u 1 year

## 2011-07-02 ENCOUNTER — Telehealth: Payer: Self-pay | Admitting: *Deleted

## 2011-07-02 NOTE — Telephone Encounter (Signed)
Patient's grandaughter informed of PAP results

## 2011-07-08 ENCOUNTER — Ambulatory Visit: Payer: Medicare Other | Admitting: Radiation Oncology

## 2011-07-15 ENCOUNTER — Ambulatory Visit: Payer: Medicare Other | Admitting: Radiation Oncology

## 2011-12-23 ENCOUNTER — Ambulatory Visit: Payer: Medicare Other | Admitting: Radiation Oncology

## 2011-12-26 ENCOUNTER — Encounter: Payer: Self-pay | Admitting: Radiation Oncology

## 2011-12-26 ENCOUNTER — Other Ambulatory Visit (HOSPITAL_COMMUNITY)
Admission: RE | Admit: 2011-12-26 | Discharge: 2011-12-26 | Disposition: A | Discharge status: Home / Self Care | Payer: Medicare Other | Source: Ambulatory Visit | Attending: Radiation Oncology | Admitting: Radiation Oncology

## 2011-12-26 ENCOUNTER — Ambulatory Visit
Admission: RE | Admit: 2011-12-26 | Discharge: 2011-12-26 | Disposition: A | Discharge status: Home / Self Care | Payer: Medicare Other | Source: Ambulatory Visit | Attending: Radiation Oncology | Admitting: Radiation Oncology

## 2011-12-26 VITALS — BP 155/71 | HR 70 | Temp 97.5°F

## 2011-12-26 DIAGNOSIS — Z923 Personal history of irradiation: Secondary | ICD-10-CM | POA: Insufficient documentation

## 2011-12-26 DIAGNOSIS — Z124 Encounter for screening for malignant neoplasm of cervix: Secondary | ICD-10-CM | POA: Insufficient documentation

## 2011-12-26 DIAGNOSIS — C549 Malignant neoplasm of corpus uteri, unspecified: Secondary | ICD-10-CM | POA: Insufficient documentation

## 2011-12-26 NOTE — Progress Notes (Signed)
Here for routine follow up endometrial cancer radiation treatment.Resides in nursing home.

## 2011-12-26 NOTE — Progress Notes (Signed)
Radiation Oncology         ([phone]) [phone] ________________________________  Name: Jasmin Beck MRN: [id number]  Date: 12/26/2011  DOB: April 02, 1919  Follow-Up Visit Note  CC: No primary provider on file.  Thompson Caul., MD  Diagnosis:   Stage II-B endometrial cancer  Interval Since Last Radiation:  3 years and 2 months,  the patient was treated with intracavitary brachytherapy treatments alone.  She received 3000 cGy in 5 fractions directed at the vaginal cuff area.  Narrative:  The patient returns today for routine follow-up.  She seems to be doing well as far as her endometrial cancer. She denies any vaginal bleeding or pelvic pain. She has chronic pain in her right knee.                              ALLERGIES:  is allergic to erythromycin; hydrocodone; penicillins; and sterapred.  Meds: Current Outpatient Prescriptions  Medication Sig Dispense Refill  . albuterol (ACCUNEB) 0.63 MG/3ML nebulizer solution Take 1 ampule by nebulization every 6 (six) hours as needed.      Marland Kitchen amLODipine (NORVASC) 10 MG tablet Take 10 mg by mouth daily.        Marland Kitchen aspirin EC 81 MG tablet Take 81 mg by mouth daily.        Marland Kitchen atorvastatin (LIPITOR) 20 MG tablet Take 20 mg by mouth daily.        . clopidogrel (PLAVIX) 75 MG tablet Take 75 mg by mouth daily.        . cyanocobalamin (,VITAMIN B-12,) 1000 MCG/ML injection Inject 1,000 mcg into the muscle every 30 (thirty) days.      Marland Kitchen dextromethorphan-guaiFENesin (MUCINEX DM) 30-600 MG per 12 hr tablet Take 1 tablet by mouth as needed. Every 12 hours      . ferrous fumarate (HEMOCYTE - 106 MG FE) 325 (106 FE) MG TABS Take 1 tablet by mouth.        . lansoprazole (PREVACID) 30 MG capsule Take 30 mg by mouth daily.        Marland Kitchen levothyroxine (SYNTHROID, LEVOTHROID) 75 MCG tablet Take 75 mcg by mouth daily.        Marland Kitchen loratadine (CLARITIN) 10 MG tablet Take 10 mg by mouth daily.        . methocarbamol (ROBAXIN) 500 MG tablet Take 500 mg by mouth as needed.      .  ondansetron (ZOFRAN) 4 MG tablet Take 4 mg by mouth every 8 (eight) hours as needed.      . polyethylene glycol powder (GLYCOLAX/MIRALAX) powder Take 17 g by mouth as needed.      . pravastatin (PRAVACHOL) 80 MG tablet Take 80 mg by mouth daily.      . sertraline (ZOLOFT) 50 MG tablet Take 50 mg by mouth daily.      . solifenacin (VESICARE) 5 MG tablet Take 5 mg by mouth at bedtime.        . sulindac (CLINORIL) 200 MG tablet Take 200 mg by mouth daily.        Marland Kitchen terbinafine (LAMISIL) 250 MG tablet Take 250 mg by mouth at bedtime.        . traMADol (ULTRAM) 50 MG tablet Take 50 mg by mouth every 8 (eight) hours as needed.      . valsartan (DIOVAN) 320 MG tablet Take 320 mg by mouth daily.        . vitamin E 400  UNIT capsule Take 800 Units by mouth daily.          Physical Findings: The patient is in no acute distress. Patient is alert and oriented.  vitals were not taken for this visit.Marland Kitchen  Examination of the inguinal area reveals no evidence of adenopathy. On pelvic examination the external genitalia are unremarkable except for some mild erythema. A speculum exam is performed. There are no mucosal lesions noted in the vaginal vault. There is mild radiation changes noted in the proximal vagina. A Pap smear was obtained of the proximal vagina. On bimanual and rectovaginal examination there no pelvic masses appreciated.  Lab Findings: Lab Results  Component Value Date   WBC 5.8 02/22/2011   HGB 8.8* 02/22/2011   HCT 25.1* 02/22/2011   MCV 89.3 02/22/2011   PLT 178 02/22/2011   -----------------------------------    Radiographic Findings: No results found.  Impression:  No evidence of recurrence on exam today, Pap smear pending  Plan:  Routine followup in one year. The patient will be seen in gynecologic oncology in approximately 6 months.  _____________________________________    Billie Lade, PhD, MD

## 2011-12-26 NOTE — Patient Instructions (Signed)
Routine followup in one year,  call for pelvic pain or vaginal bleeding

## 2012-01-01 ENCOUNTER — Telehealth: Payer: Self-pay

## 2012-01-01 NOTE — Telephone Encounter (Signed)
Message copied by Jayme Cloud on Wed Jan 01, 2012  3:02 PM ------      Message from: Antony Blackbird D      Created: Tue Dec 31, 2011  2:37 PM      Regarding: normal pap smear       Please inform patient of good pap smear results.      Thanks, jk      ----- Message -----         From: [facility] Interface         Sent: 12/31/2011   2:02 PM           To: Billie Lade, MD

## 2012-01-01 NOTE — Telephone Encounter (Signed)
Informed patient's granddaughter that pap smear results from 12/26/11 normal as patient in nursing home facility.

## 2012-12-18 ENCOUNTER — Encounter: Payer: Self-pay | Admitting: Oncology

## 2012-12-24 ENCOUNTER — Encounter: Payer: Self-pay | Admitting: Radiation Oncology

## 2012-12-24 ENCOUNTER — Other Ambulatory Visit (HOSPITAL_COMMUNITY)
Admission: RE | Admit: 2012-12-24 | Discharge: 2012-12-24 | Disposition: A | Discharge status: Home / Self Care | Payer: Medicare Other | Source: Ambulatory Visit | Attending: Radiation Oncology | Admitting: Radiation Oncology

## 2012-12-24 ENCOUNTER — Ambulatory Visit
Admission: RE | Admit: 2012-12-24 | Discharge: 2012-12-24 | Disposition: A | Discharge status: Home / Self Care | Payer: Medicare Other | Source: Ambulatory Visit | Attending: Radiation Oncology | Admitting: Radiation Oncology

## 2012-12-24 VITALS — BP 131/64 | HR 66 | Temp 97.8°F | Resp 20 | Ht 64.0 in | Wt 138.2 lb

## 2012-12-24 DIAGNOSIS — C549 Malignant neoplasm of corpus uteri, unspecified: Secondary | ICD-10-CM

## 2012-12-24 DIAGNOSIS — Z124 Encounter for screening for malignant neoplasm of cervix: Secondary | ICD-10-CM | POA: Insufficient documentation

## 2012-12-24 NOTE — Progress Notes (Signed)
Pap smear collected  By Dr. Roselind Messier , and sent  Specimen to lab, patient tolerated well, patient granddaughter helped get patient dressed, to get 6 month f/u with gyn MD, no need to come back to see Dr.Kinard D/c back to nursing home

## 2012-12-24 NOTE — Progress Notes (Signed)
Radiation Oncology         [phone] ________________________________  Name: Jasmin Beck MRN: [id number]  Date: 12/24/2012  DOB: 06/24/1919  Follow-Up Visit Note  CC: No primary provider on file.  Thompson Caul., MD  Diagnosis:   Stage II-B. endometrial carcinoma  Interval Since Last Radiation:  4 years and 2 months, the patient completed intracavitary brachytherapy treatments, 30 gray in 5 fractions   Narrative:  The patient returns today for routine follow-up.  Patient is currently residing in a nursing home. She did fracture her hip. She also has severe osteoarthritis involving the knees. Patient is nonambulatory at this time and somewhat forgetful but otherwise seems to be doing reasonably well. She is accompanied by her granddaughter on evaluation today. There is no history of vaginal bleeding or pelvic pain.                             ALLERGIES:  is allergic to erythromycin; hydrocodone; penicillins; and sterapred.  Meds: Current Outpatient Prescriptions  Medication Sig Dispense Refill  . amLODipine (NORVASC) 10 MG tablet Take 10 mg by mouth daily.        Marland Kitchen aspirin EC 81 MG tablet Take 81 mg by mouth daily.        . ciprofloxacin (CIPRO) 250 MG tablet Take 250 mg by mouth 2 (two) times daily.      . clopidogrel (PLAVIX) 75 MG tablet Take 75 mg by mouth daily.        . cyanocobalamin (,VITAMIN B-12,) 1000 MCG/ML injection Inject 1,000 mcg into the muscle every 30 (thirty) days.      . ferrous fumarate (HEMOCYTE - 106 MG FE) 325 (106 FE) MG TABS Take 1 tablet by mouth.        . levothyroxine (SYNTHROID, LEVOTHROID) 75 MCG tablet Take 75 mcg by mouth daily.        Marland Kitchen loratadine (CLARITIN) 10 MG tablet Take 10 mg by mouth daily.        . ondansetron (ZOFRAN) 4 MG tablet Take 4 mg by mouth every 8 (eight) hours as needed.      . polyethylene glycol powder (GLYCOLAX/MIRALAX) powder Take 17 g by mouth as needed.      . pravastatin (PRAVACHOL) 80 MG tablet Take 80 mg by mouth  daily.      . sertraline (ZOLOFT) 50 MG tablet Take 50 mg by mouth daily.      . solifenacin (VESICARE) 5 MG tablet Take 5 mg by mouth at bedtime.        . sulindac (CLINORIL) 200 MG tablet Take 200 mg by mouth daily.        . vitamin E 400 UNIT capsule Take 800 Units by mouth daily.        Marland Kitchen albuterol (ACCUNEB) 0.63 MG/3ML nebulizer solution Take 1 ampule by nebulization every 6 (six) hours as needed.      Marland Kitchen dextromethorphan-guaiFENesin (MUCINEX DM) 30-600 MG per 12 hr tablet Take 1 tablet by mouth as needed. Every 12 hours      . lansoprazole (PREVACID) 30 MG capsule Take 30 mg by mouth daily.        . methocarbamol (ROBAXIN) 500 MG tablet Take 500 mg by mouth as needed.      . terbinafine (LAMISIL) 250 MG tablet Take 250 mg by mouth at bedtime.        . traMADol (ULTRAM) 50 MG tablet Take 50 mg  by mouth every 8 (eight) hours as needed.      . valsartan (DIOVAN) 320 MG tablet Take 320 mg by mouth daily.         No current facility-administered medications for this encounter.    Physical Findings: The patient is in no acute distress. Patient is alert and oriented.  height is 5\' 4"  (1.626 m) and weight is 138 lb 3.2 oz (62.687 kg). Her oral temperature is 97.8 F (36.6 C). Her blood pressure is 131/64 and her pulse is 66. Her respiration is 20. Marland Kitchen No palpable supraclavicular or axillary adenopathy. The lungs are clear to auscultation. The heart has a regular rhythm and rate. The abdomen is soft and nontender with normal bowel sounds. There is no inguinal adenopathy appreciated. On pelvic examination the external genitalia are unremarkable. Speculum exam is performed. There some mild radiation changes noted in the proximal vagina appear there no mucosal lesions noted. A Pap smear was obtained of the proximal vagina. On bimanual examination there no pelvic masses appreciated.  Lab Findings: Lab Results  Component Value Date   WBC 5.8 02/22/2011   HGB 8.8* 02/22/2011   HCT 25.1* 02/22/2011   MCV  89.3 02/22/2011   PLT 178 02/22/2011      Radiographic Findings: No results found.  Impression:  No evidence recurrence on clinical exam today, Pap smear pending  Plan:  When necessary followup in radiation oncology in light of the interval since treatment. Patient will followup with gynecologic oncology in approximately 6 months to complete her 5 year followup plan.  _____________________________________  -----------------------------------  Billie Lade, PhD, MD

## 2012-12-24 NOTE — Progress Notes (Signed)
Follow up s/p endometrial  Cancer radiation ove 3 years ago, patient in w/c, unable to get weight, was weighed at nursing home 138.2lb, no c/o pain or discharge  or bleeding from vagina,she is taking Cipro 250mg  bid po till tomorrow for UTI, is confused, she didn't  Go to her GYN in May 2014, missed, regular bowel movements, great appetite, states"I can't walk, no pain but right leg knee bone on bone",  10:51 AM

## 2012-12-29 ENCOUNTER — Telehealth: Payer: Self-pay | Admitting: Oncology

## 2012-12-29 NOTE — Telephone Encounter (Signed)
Called and spoke to Hewitt Blade Yadav's granddaughter.  Let her know that Mee's pap smear results were good.

## 2013-06-24 ENCOUNTER — Ambulatory Visit: Payer: Medicare Other | Admitting: Gynecologic Oncology

## 2013-08-10 ENCOUNTER — Telehealth: Payer: Self-pay | Admitting: *Deleted

## 2013-08-10 NOTE — Telephone Encounter (Signed)
Returned call from [employer] regarding pt. Unable to reach lmov[facility] to call back with her concerns.

## 2013-08-12 ENCOUNTER — Ambulatory Visit: Payer: Medicare Other | Admitting: Gynecologic Oncology

## 2014-08-19 DEATH — deceased
